# Patient Record
Sex: Female | Born: 1990 | Hispanic: No | Marital: Single | Smoking: Never smoker
Health system: Southern US, Community
[De-identification: ages and names within clinical notes are randomized; demographics above are authoritative.]

## PROBLEM LIST (undated history)

## (undated) DIAGNOSIS — Z789 Other specified health status: Secondary | ICD-10-CM

## (undated) HISTORY — PX: NO PAST SURGERIES: SHX2092

---
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
## 2012-06-27 ENCOUNTER — Emergency Department: Payer: Self-pay | Admitting: Emergency Medicine

## 2012-06-27 LAB — BASIC METABOLIC PANEL
Anion Gap: 6 — ABNORMAL LOW (ref 7–16)
BUN: 10 mg/dL (ref 7–18)
Calcium, Total: 8.5 mg/dL (ref 8.5–10.1)
Chloride: 106 mmol/L (ref 98–107)
Co2: 28 mmol/L (ref 21–32)
Creatinine: 0.81 mg/dL (ref 0.60–1.30)
EGFR (African American): >60
EGFR (Non-African Amer.): >60
Glucose: 75 mg/dL (ref 65–99)
Osmolality: 277 (ref 275–301)
Potassium: 3.1 mmol/L — ABNORMAL LOW (ref 3.5–5.1)
Sodium: 140 mmol/L (ref 136–145)

## 2012-06-27 LAB — CBC
HCT: 34.4 % — ABNORMAL LOW (ref 35.0–47.0)
HGB: 10.7 g/dL — ABNORMAL LOW (ref 12.0–16.0)
MCH: 26.3 pg (ref 26.0–34.0)
MCHC: 31.1 g/dL — ABNORMAL LOW (ref 32.0–36.0)
MCV: 85 fL (ref 80–100)
Platelet: 230 10*3/uL (ref 150–440)
RBC: 4.06 10*6/uL (ref 3.80–5.20)
RDW: 14.6 % — ABNORMAL HIGH (ref 11.5–14.5)
WBC: 15.3 10*3/uL — ABNORMAL HIGH (ref 3.6–11.0)

## 2014-06-26 ENCOUNTER — Emergency Department: Payer: Self-pay | Admitting: Emergency Medicine

## 2016-05-29 ENCOUNTER — Encounter (HOSPITAL_COMMUNITY): Payer: Self-pay | Admitting: *Deleted

## 2016-05-29 ENCOUNTER — Emergency Department (HOSPITAL_COMMUNITY)
Admission: EM | Admit: 2016-05-29 | Discharge: 2016-05-29 | Disposition: A | Discharge status: Home / Self Care | Payer: No Typology Code available for payment source | Attending: Emergency Medicine | Admitting: Emergency Medicine

## 2016-05-29 DIAGNOSIS — S161XXA Strain of muscle, fascia and tendon at neck level, initial encounter: Secondary | ICD-10-CM | POA: Diagnosis not present

## 2016-05-29 DIAGNOSIS — Y998 Other external cause status: Secondary | ICD-10-CM | POA: Diagnosis not present

## 2016-05-29 DIAGNOSIS — S3992XA Unspecified injury of lower back, initial encounter: Secondary | ICD-10-CM | POA: Insufficient documentation

## 2016-05-29 DIAGNOSIS — S199XXA Unspecified injury of neck, initial encounter: Secondary | ICD-10-CM | POA: Diagnosis present

## 2016-05-29 DIAGNOSIS — Y92410 Unspecified street and highway as the place of occurrence of the external cause: Secondary | ICD-10-CM | POA: Diagnosis not present

## 2016-05-29 DIAGNOSIS — Y9389 Activity, other specified: Secondary | ICD-10-CM | POA: Insufficient documentation

## 2016-05-29 MED ORDER — IBUPROFEN 100 MG/5ML PO SUSP: 600.0000 mg | Freq: Four times a day (QID) | ORAL | Status: DC | PRN
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
## 2016-05-29 NOTE — Discharge Instructions (Signed)

## 2016-05-29 NOTE — ED Notes (Signed)
Declined W/C at D/C and was escorted to lobby by RN. 

## 2018-07-09 ENCOUNTER — Other Ambulatory Visit: Payer: Self-pay

## 2018-07-09 DIAGNOSIS — R1030 Lower abdominal pain, unspecified: Secondary | ICD-10-CM | POA: Insufficient documentation

## 2018-07-09 DIAGNOSIS — N898 Other specified noninflammatory disorders of vagina: Secondary | ICD-10-CM | POA: Insufficient documentation

## 2018-07-09 DIAGNOSIS — N760 Acute vaginitis: Secondary | ICD-10-CM | POA: Insufficient documentation

## 2018-07-09 DIAGNOSIS — B9689 Other specified bacterial agents as the cause of diseases classified elsewhere: Secondary | ICD-10-CM | POA: Insufficient documentation

## 2018-07-09 LAB — COMPREHENSIVE METABOLIC PANEL
ALBUMIN: 3.6 g/dL (ref 3.5–5.0)
ALT: 13 U/L (ref 0–44)
ANION GAP: 9 (ref 5–15)
AST: 18 U/L (ref 15–41)
Alkaline Phosphatase: 61 U/L (ref 38–126)
BUN: 15 mg/dL (ref 6–20)
CHLORIDE: 104 mmol/L (ref 98–111)
CO2: 27 mmol/L (ref 22–32)
Calcium: 9.3 mg/dL (ref 8.9–10.3)
Creatinine, Ser: 0.75 mg/dL (ref 0.44–1.00)
GFR calc Af Amer: >60 mL/min (ref >60)
GFR calc non Af Amer: >60 mL/min (ref >60)
GLUCOSE: 87 mg/dL (ref 70–99)
POTASSIUM: 3.3 mmol/L — AB (ref 3.5–5.1)
SODIUM: 140 mmol/L (ref 135–145)
Total Bilirubin: 0.5 mg/dL (ref 0.3–1.2)
Total Protein: 7.4 g/dL (ref 6.5–8.1)

## 2018-07-09 LAB — CBC
HEMATOCRIT: 37.9 % (ref 36.0–46.0)
HEMOGLOBIN: 12 g/dL (ref 12.0–15.0)
MCH: 28.3 pg (ref 26.0–34.0)
MCHC: 31.7 g/dL (ref 30.0–36.0)
MCV: 89.4 fL (ref 78.0–100.0)
Platelets: 307 10*3/uL (ref 150–400)
RBC: 4.24 MIL/uL (ref 3.87–5.11)
RDW: 13.6 % (ref 11.5–15.5)
WBC: 8.6 10*3/uL (ref 4.0–10.5)

## 2018-07-09 LAB — I-STAT BETA HCG BLOOD, ED (MC, WL, AP ONLY): I-stat hCG, quantitative: <5 m[IU]/mL (ref <5)

## 2018-07-09 LAB — LIPASE, BLOOD: LIPASE: 34 U/L (ref 11–51)

## 2018-07-09 NOTE — ED Triage Notes (Signed)
Pt presents to ED from home for ABD pain. Pt reports that pain has been going on for about a month, but worse in the last few days. Pt reports pain in lower ABD, denies N/V/D. No fevers/chills. Pt reports increase in vaginal discharge, but no bleeding.

## 2018-07-10 ENCOUNTER — Emergency Department (HOSPITAL_COMMUNITY)
Admission: EM | Admit: 2018-07-10 | Discharge: 2018-07-10 | Disposition: A | Discharge status: Home / Self Care | Payer: Self-pay | Attending: Emergency Medicine | Admitting: Emergency Medicine

## 2018-07-10 DIAGNOSIS — R1030 Lower abdominal pain, unspecified: Secondary | ICD-10-CM

## 2018-07-10 DIAGNOSIS — B9689 Other specified bacterial agents as the cause of diseases classified elsewhere: Secondary | ICD-10-CM

## 2018-07-10 DIAGNOSIS — N760 Acute vaginitis: Secondary | ICD-10-CM

## 2018-07-10 LAB — URINALYSIS, ROUTINE W REFLEX MICROSCOPIC
Bilirubin Urine: NEGATIVE
GLUCOSE, UA: NEGATIVE mg/dL
Ketones, ur: 5 mg/dL — AB
Leukocytes, UA: NEGATIVE
NITRITE: NEGATIVE
PROTEIN: NEGATIVE mg/dL
SPECIFIC GRAVITY, URINE: 1.025 (ref 1.005–1.030)
pH: 5 (ref 5.0–8.0)

## 2018-07-10 LAB — HIV ANTIBODY (ROUTINE TESTING W REFLEX): HIV Screen 4th Generation wRfx: NONREACTIVE

## 2018-07-10 LAB — WET PREP, GENITAL
SPERM: NONE SEEN
Trich, Wet Prep: NONE SEEN
YEAST WET PREP: NONE SEEN

## 2018-07-10 LAB — RPR: RPR Ser Ql: NONREACTIVE

## 2018-07-10 MED ORDER — METRONIDAZOLE 0.75 % VA GEL: 1.0000 | Freq: Two times a day (BID) | VAGINAL | 0 refills | Status: DC

## 2018-07-10 NOTE — Discharge Instructions (Addendum)
Use the Metrogel as directed for bacterial vaginosis infection. It is [facility] and recheck of abnormal appearance of the cervix.

## 2018-07-10 NOTE — ED Provider Notes (Signed)
[facility]-EMERGENCY DEPT Provider Note   CSN: [id number] Arrival date & time: 07/09/18  2050     History   Chief Complaint Chief Complaint  Patient presents with  . Abdominal Pain    HPI Marie Williamson is a 27 y.o. female.  Patient is here for evaluation of lower abdominal pain that started 2 weeks ago. The discomfort is intermittent last ing a few minutes when it starts. No fever, nausea, vomiting, dysuria or irregular menses. She is not on exogenous hormones of any kind. She reports vaginal discharge that is mild, does not have an odor. She states she and her longtime boyfriend stopped using barrier protection birth control several months ago and that is when symptoms started altogether.   The history is provided by the patient. No language interpreter was used.  Abdominal Pain   Pertinent negatives include fever, nausea, vomiting and dysuria.    No past medical history on file.  There are no active problems to display for this patient.   No past surgical history on file.   OB History   None      Home Medications    Prior to Admission medications   Medication Sig Start Date End Date Taking? Authorizing Provider  ibuprofen (CHILDRENS MOTRIN) 100 MG/5ML suspension Take 30 mLs (600 mg total) by mouth every 6 (six) hours as needed for moderate pain. Patient not taking: Reported on 07/10/2018 05/29/16   Cheri Fowlerose, Kayla, PA-C    Family History No family history on file.  Social History Social History   Tobacco Use  . Smoking status: Never Smoker  Substance Use Topics  . Alcohol use: No  . Drug use: No     Allergies   Patient has no known allergies.   Review of Systems Review of Systems  Constitutional: Negative for chills and fever.  Respiratory: Negative.   Cardiovascular: Negative.   Gastrointestinal: Positive for abdominal pain. Negative for nausea and vomiting.  Genitourinary: Positive for vaginal discharge. Negative for dysuria  and menstrual problem.  Musculoskeletal: Negative.   Skin: Negative.   Neurological: Negative.      Physical Exam Updated Vital Signs BP (!) 110/97 (BP Location: Right Arm)   Pulse 68   Temp 98.3 F (36.8 C) (Oral)   Resp 19   Ht 5\' 7"  (1.702 m)   Wt (!) 138.7 kg (305 lb 11.2 oz)   SpO2 100%   BMI 47.88 kg/m   Physical Exam  Constitutional: She appears well-developed and well-nourished.  HENT:  Head: Normocephalic.  Neck: Normal range of motion. Neck supple.  Cardiovascular: Normal rate and regular rhythm.  Pulmonary/Chest: Effort normal and breath sounds normal.  Abdominal: Soft. Bowel sounds are normal. There is no tenderness. There is no rebound and no guarding.  Genitourinary:  Genitourinary Comments: White vaginal discharge that has smooth consistency. Cervical opening is widened with intracervical mass present. No redness, friability, discharge or tenderness. No adnexal mass or tenderness.   Musculoskeletal: Normal range of motion.  Neurological: She is alert. No cranial nerve deficit.  Skin: Skin is warm and dry. No rash noted.  Psychiatric: She has a normal mood and affect.     ED Treatments / Results  Labs (all labs ordered are listed, but only abnormal results are displayed) Labs Reviewed  WET PREP, GENITAL - Abnormal; Notable for the following components:      Result Value   Clue Cells Wet Prep HPF POC PRESENT (*)    WBC, Wet Prep HPF  POC MODERATE (*)    All other components within normal limits  COMPREHENSIVE METABOLIC PANEL - Abnormal; Notable for the following components:   Potassium 3.3 (*)    All other components within normal limits  URINALYSIS, ROUTINE W REFLEX MICROSCOPIC - Abnormal; Notable for the following components:   APPearance HAZY (*)    Hgb urine dipstick SMALL (*)    Ketones, ur 5 (*)    Bacteria, UA RARE (*)    All other components within normal limits  LIPASE, BLOOD  CBC  RPR  HIV ANTIBODY (ROUTINE TESTING)  I-STAT BETA HCG  BLOOD, ED ([facility], [facility], [facility] ONLY)  GC/CHLAMYDIA PROBE AMP ([place]) NOT AT [facility]    EKG None  Radiology No results found.  Procedures Procedures (including critical care time)  Medications Ordered in ED Medications - No data to display   Initial Impression / Assessment and Plan / ED Course  I have reviewed the triage vital signs and the nursing notes.  Pertinent labs & imaging results that were available during my care of the patient were reviewed by me and considered in my medical decision making (see chart for details).     Patient presents with intermittent lower abdominal pain for several months, increased frequency and intensity for the past 2 weeks. Pain comes and goes, lasting 1-2 minutes at a time.   Vaginal exam is not concerning for STD based on appearance of discharge and lack of tenderness. Cervical exam is abnormal but with duration of symptoms, normal labs and vital signs, it is felt this can be worked up in the outpatient setting. She is referred to [facility] for further gynecologic evaluation. Stressed the importance of follow up to determine a diagnosis and patient acknowledged understanding.   She does have BV and is given Rx for Metrogel. She is appropriate for discharge home.   Final Clinical Impressions(s) / ED Diagnoses   Final diagnoses:  None   1. BV 2. Abnormal cervical exam  ED Discharge Orders    None       Elpidio Anis, Cordelia Poche 07/10/18 0535    Glynn Octave, MD 07/10/18 [phone]

## 2018-07-11 LAB — GC/CHLAMYDIA PROBE AMP (~~LOC~~) NOT AT ARMC
Chlamydia: NEGATIVE
NEISSERIA GONORRHEA: NEGATIVE

## 2018-07-26 ENCOUNTER — Encounter (HOSPITAL_COMMUNITY): Payer: Self-pay | Admitting: Emergency Medicine

## 2018-07-26 ENCOUNTER — Emergency Department (HOSPITAL_COMMUNITY)
Admission: EM | Admit: 2018-07-26 | Discharge: 2018-07-27 | Disposition: A | Discharge status: Home / Self Care | Payer: BLUE CROSS/BLUE SHIELD | Attending: Emergency Medicine | Admitting: Emergency Medicine

## 2018-07-26 ENCOUNTER — Emergency Department (HOSPITAL_COMMUNITY): Payer: BLUE CROSS/BLUE SHIELD

## 2018-07-26 DIAGNOSIS — R0789 Other chest pain: Secondary | ICD-10-CM | POA: Diagnosis not present

## 2018-07-26 DIAGNOSIS — R079 Chest pain, unspecified: Secondary | ICD-10-CM | POA: Diagnosis present

## 2018-07-26 LAB — I-STAT TROPONIN, ED: Troponin i, poc: 0 ng/mL (ref 0.00–0.08)

## 2018-07-26 LAB — CBC
HEMATOCRIT: 36.7 % (ref 36.0–46.0)
Hemoglobin: 11.2 g/dL — ABNORMAL LOW (ref 12.0–15.0)
MCH: 27.5 pg (ref 26.0–34.0)
MCHC: 30.5 g/dL (ref 30.0–36.0)
MCV: 90.2 fL (ref 78.0–100.0)
PLATELETS: 266 10*3/uL (ref 150–400)
RBC: 4.07 MIL/uL (ref 3.87–5.11)
RDW: 12.9 % (ref 11.5–15.5)
WBC: 7.7 10*3/uL (ref 4.0–10.5)

## 2018-07-26 LAB — BASIC METABOLIC PANEL
Anion gap: 7 (ref 5–15)
BUN: 11 mg/dL (ref 6–20)
CO2: 24 mmol/L (ref 22–32)
CREATININE: 0.86 mg/dL (ref 0.44–1.00)
Calcium: 8.6 mg/dL — ABNORMAL LOW (ref 8.9–10.3)
Chloride: 107 mmol/L (ref 98–111)
GFR calc Af Amer: >60 mL/min (ref >60)
GLUCOSE: 92 mg/dL (ref 70–99)
POTASSIUM: 3.7 mmol/L (ref 3.5–5.1)
SODIUM: 138 mmol/L (ref 135–145)

## 2018-07-26 LAB — I-STAT BETA HCG BLOOD, ED (MC, WL, AP ONLY): I-stat hCG, quantitative: <5 m[IU]/mL (ref <5)

## 2018-07-26 NOTE — ED Notes (Signed)
Results reviewed.  No changes in acuity at this time 

## 2018-07-26 NOTE — ED Notes (Signed)
NT made this RN aware of patient's HR of low 50's.  Patient does not c/o any changes in symptoms.  Will continue to monitor

## 2018-07-26 NOTE — ED Notes (Signed)
Results reviewed, no changes in acuity at this time 

## 2018-07-26 NOTE — ED Triage Notes (Signed)
Pt presents to ED for assessment of left sided chest pain, radiating from her left neck, left shoulder, and under her left breast starting Thursday.  Patient states sharp and intense on Thursday, now residual pressure.  Patient c/o SOB with it, denies n/v, denies light-headedness.  States it was so bad Thursday she couldn't sleep.  States she was having difficulty "completing a yawn" and taking a deep breath

## 2018-07-27 MED ORDER — PANTOPRAZOLE SODIUM 20 MG PO TBEC: 20.0000 mg | DELAYED_RELEASE_TABLET | Freq: Every day | ORAL | 0 refills | Status: DC

## 2018-07-27 MED ORDER — ACETAMINOPHEN 500 MG PO TABS
1000.0000 mg | ORAL_TABLET | Freq: Once | ORAL | Status: AC
  Administered 2018-07-27: 1000 mg via ORAL
  Filled 2018-07-27: qty 2

## 2018-07-27 MED ORDER — GI COCKTAIL ~~LOC~~
30.0000 mL | Freq: Once | ORAL | Status: AC
  Administered 2018-07-27: 30 mL via ORAL
  Filled 2018-07-27: qty 30

## 2018-07-27 NOTE — Discharge Instructions (Signed)
Take protonix daily for pain.  Follow up with a primary care doctor. You may contact the [phone] number in the back of this paperwork to help set up primary care.  Follow up with ob/gyn for further evaluation of your cramps.  Return to the ER if you develop worsening pain, increased shortness of breath, or any new or concerning symptoms.

## 2018-07-27 NOTE — ED Notes (Signed)
ED Provider at bedside. 

## 2018-07-27 NOTE — ED Provider Notes (Signed)
[facility] EMERGENCY [facility]PARTMENT Provider Note   CSN: [id number] Arrival date & time: 07/26/18  1929     History   Chief Complaint Chief Complaint  Patient presents with  . Chest Pain    HPI Marie Williamson is a 27 y.o. female presenting for evaluation of chest pain.  Patient states 3 days ago, she started to develop left-sided chest pain/pressure.  This began while she was laying flat and after taking increased Motrin due to menstrual cramps.  She states pain is worse when she eats certain foods and with inspiration.  She denies associated shortness of breath.  Denies fevers, chills, cough, nausea, vomiting, abdominal pain, diaphoresis, urinary symptoms, leg pain or swelling.  Denies recent travel, surgeries, immobilization, trauma, history of cancer, history of prior DVT/PE, or hormone use.  She states she has been burping more frequently.  She recently finished a course of antibiotics.  HPI  History reviewed. No pertinent past medical history.  There are no active problems to display for this patient.   History reviewed. No pertinent surgical history.   OB History   None      Home Medications    Prior to Admission medications   Medication Sig Start Date End Date Taking? Authorizing Provider  ibuprofen (CHILDRENS MOTRIN) 100 MG/5ML suspension Take 30 mLs (600 mg total) by mouth every 6 (six) hours as needed for moderate pain. Patient not taking: Reported on 07/10/2018 05/29/16   Cheri Fowlerose, Kayla, PA-C  metroNIDAZOLE (METROGEL VAGINAL) 0.75 % vaginal gel Place 1 Applicatorful vaginally 2 (two) times daily. 07/10/18   Elpidio AnisUpstill, Shari, PA-C  pantoprazole (PROTONIX) 20 MG tablet Take 1 tablet (20 mg total) by mouth daily. 07/27/18   Kael Keetch, PA-C    Family History History reviewed. No pertinent family history.  Social History Social History   Tobacco Use  . Smoking status: Never Smoker  . Smokeless tobacco: Never Used  Substance Use Topics  .  Alcohol use: No  . Drug use: No     Allergies   Patient has no known allergies.   Review of Systems Review of Systems  Cardiovascular: Positive for chest pain.  Gastrointestinal:       Burping  All other systems reviewed and are negative.    Physical Exam Updated Vital Signs BP 91/70 (BP Location: Right Arm)   Pulse 61   Temp 98.9 F (37.2 C) (Oral)   Resp 17   LMP 07/26/2018   SpO2 100%   Physical Exam  Constitutional: She is oriented to person, place, and time. She appears well-developed and well-nourished. No distress.  Resting comfortably in bed in no distress  HENT:  Head: Normocephalic and atraumatic.  Eyes: Pupils are equal, round, and reactive to light. Conjunctivae and EOM are normal.  Neck: Normal range of motion. Neck supple.  Cardiovascular: Normal rate, regular rhythm and intact distal pulses.  Pulmonary/Chest: Effort normal and breath sounds normal. No respiratory distress. She has no wheezes. She exhibits tenderness.  Tenderness palpation of central chest wall and epigastric abdomen.  Clear lung sounds in all fields.  Abdominal: Soft. Bowel sounds are normal. She exhibits no distension and no mass. There is tenderness. There is no rebound and no guarding.  Mild epigastric tenderness.  Musculoskeletal: Normal range of motion.  No leg pain or swelling.  Pedal pulses intact bilaterally.  Neurological: She is alert and oriented to person, place, and time.  Skin: Skin is warm and dry.  Psychiatric: She has a normal mood  and affect.  Nursing note and vitals reviewed.    ED Treatments / Results  Labs (all labs ordered are listed, but only abnormal results are displayed) Labs Reviewed  BASIC METABOLIC PANEL - Abnormal; Notable for the following components:      Result Value   Calcium 8.6 (*)    All other components within normal limits  CBC - Abnormal; Notable for the following components:   Hemoglobin 11.2 (*)    All other components within normal  limits  I-STAT TROPONIN, ED  I-STAT BETA HCG BLOOD, ED ([facility], [facility], [facility] ONLY)    EKG None  Radiology Dg Chest 2 View  Result Date: 07/26/2018 CLINICAL DATA:  Chest tightness/pressure with SOB on and off since Thursday EXAM: CHEST - 2 VIEW COMPARISON:  None. FINDINGS: The heart size and mediastinal contours are within normal limits. Both lungs are clear. The visualized skeletal structures are unremarkable. IMPRESSION: No active cardiopulmonary disease. Electronically Signed   By: Norva Pavlov M.D.   On: 07/26/2018 19:54    Procedures Procedures (including critical care time)  Medications Ordered in ED Medications  gi cocktail (Maalox,Lidocaine,Donnatal) (30 mLs Oral Given 07/27/18 0213)  acetaminophen (TYLENOL) tablet 1,000 mg (1,000 mg Oral Given 07/27/18 0212)     Initial Impression / Assessment and Plan / ED Course  I have reviewed the triage vital signs and the nursing notes.  Pertinent labs & imaging results that were available during my care of the patient were reviewed by me and considered in my medical decision making (see chart for details).     Patient presented for evaluation of chest pain.  Physical exam reassuring, she is afebrile not tachycardic.  Appears nontoxic.  Pain is reproducible with palpation of the chest wall.  She has associated burping. Initial cardiac work-up reassuring, troponin negative, EKG without STEMI, chest x-ray read interpreted by me, no ammonia or cardiomegaly.  Doubt ACS.  Low risk for PE.  Symptoms began after finishing antibiotics after taking increased NSAIDs.  Thus question gastritis/gerd or other GI etiology of sxs.  Discussed with patient.  Discussed follow-up with primary care for further evaluation of her symptoms.  Patient's mom requesting more information about menstrual cramps, recommended she follow-up with OB/GYN, patient is not currently having any menstrual cramps.  GI cocktail given for symptom relief.  At this time, patient appears safe  for discharge.  Return precautions given.  Patient states he understands and agrees plan.   Final Clinical Impressions(s) / ED Diagnoses   Final diagnoses:  Atypical chest pain    ED Discharge Orders        Ordered    pantoprazole (PROTONIX) 20 MG tablet  Daily     07/27/18 0233       Alveria Apley, PA-C 07/27/18 0252    Geoffery Lyons, MD 07/28/18 [phone]

## 2018-07-27 NOTE — ED Notes (Signed)
Patient left at this time with all belongings. 

## 2018-08-20 ENCOUNTER — Ambulatory Visit: Payer: Self-pay | Admitting: Obstetrics and Gynecology

## 2019-07-17 IMAGING — DX DG CHEST 2V
2 series · 2 of 2 positions shown · non-contrast
Comparison: None.

CLINICAL DATA: Chest tightness/pressure with SOB on and off since
[REDACTED]

EXAM:
CHEST - 2 VIEW

[w chest pa]
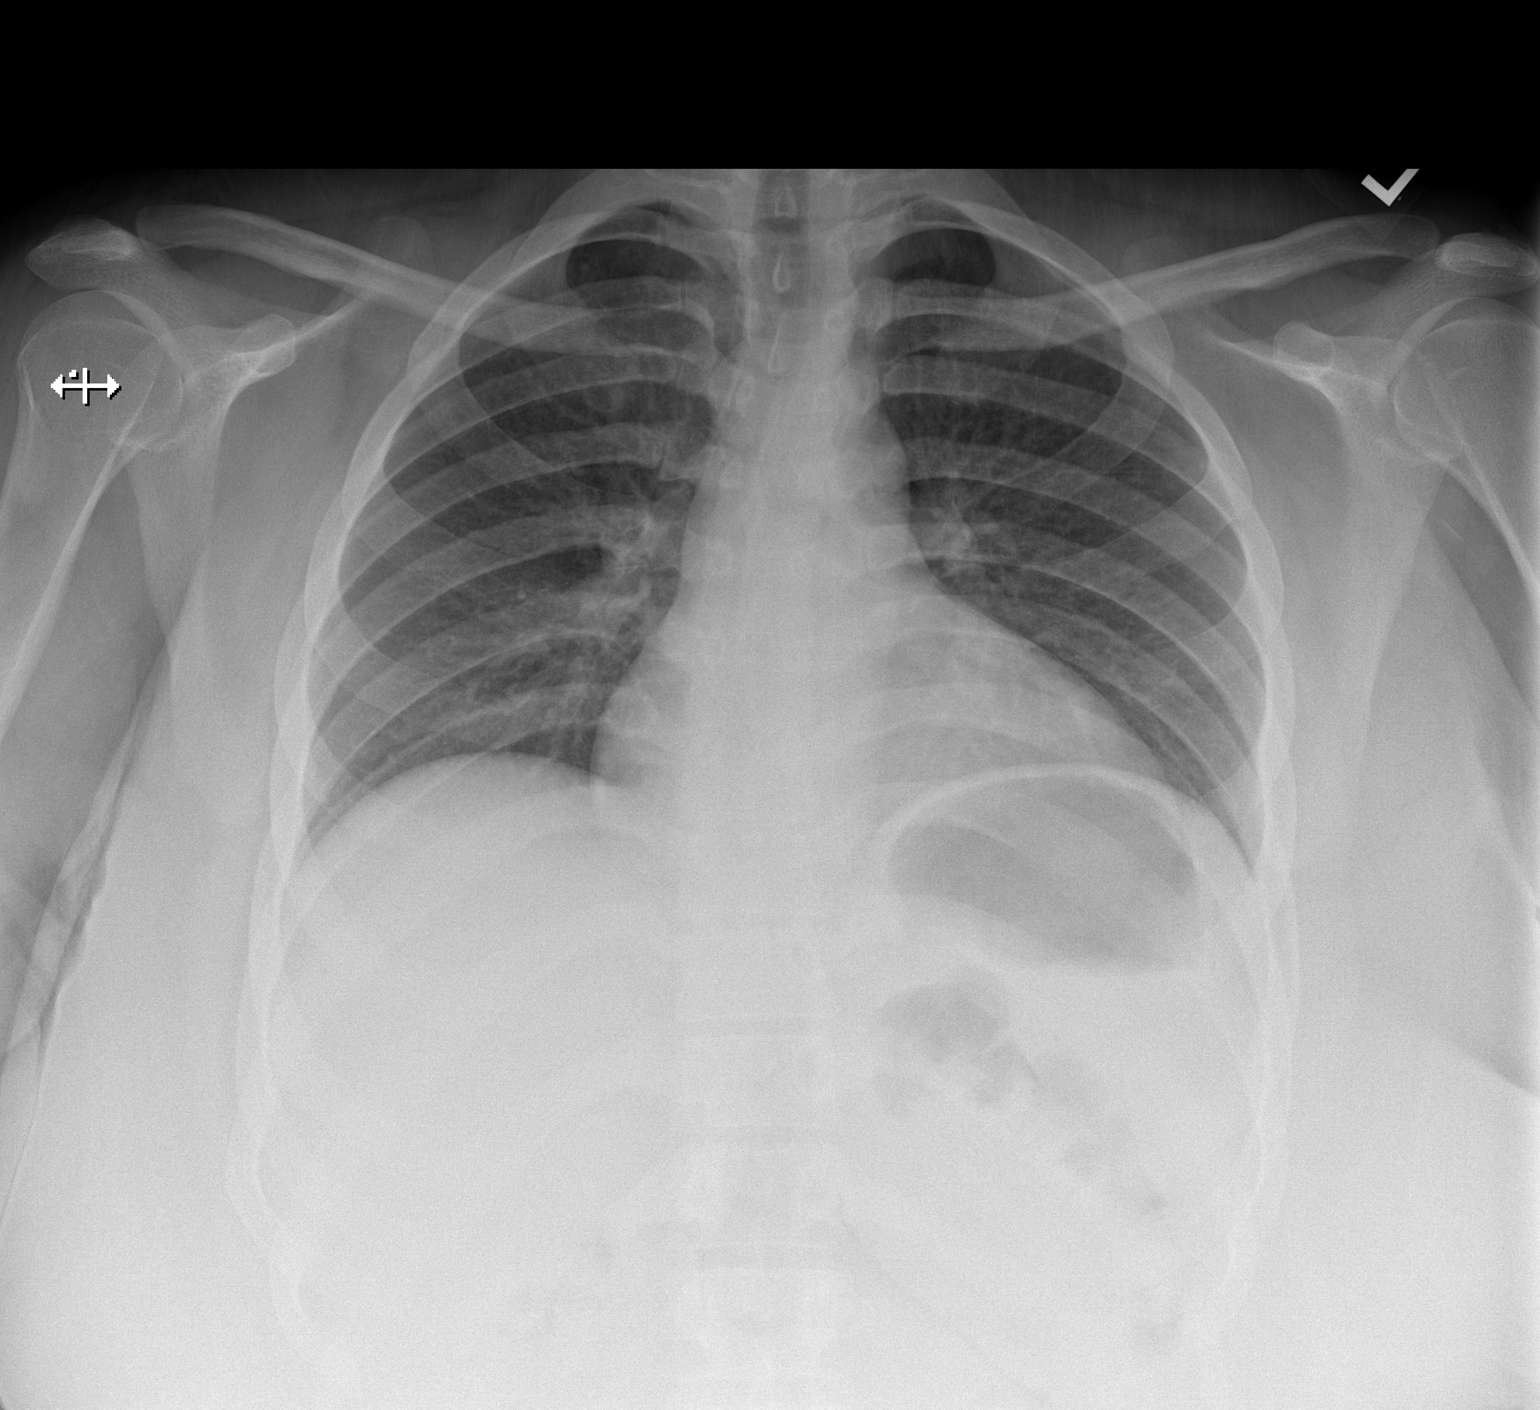

[w chest lat]
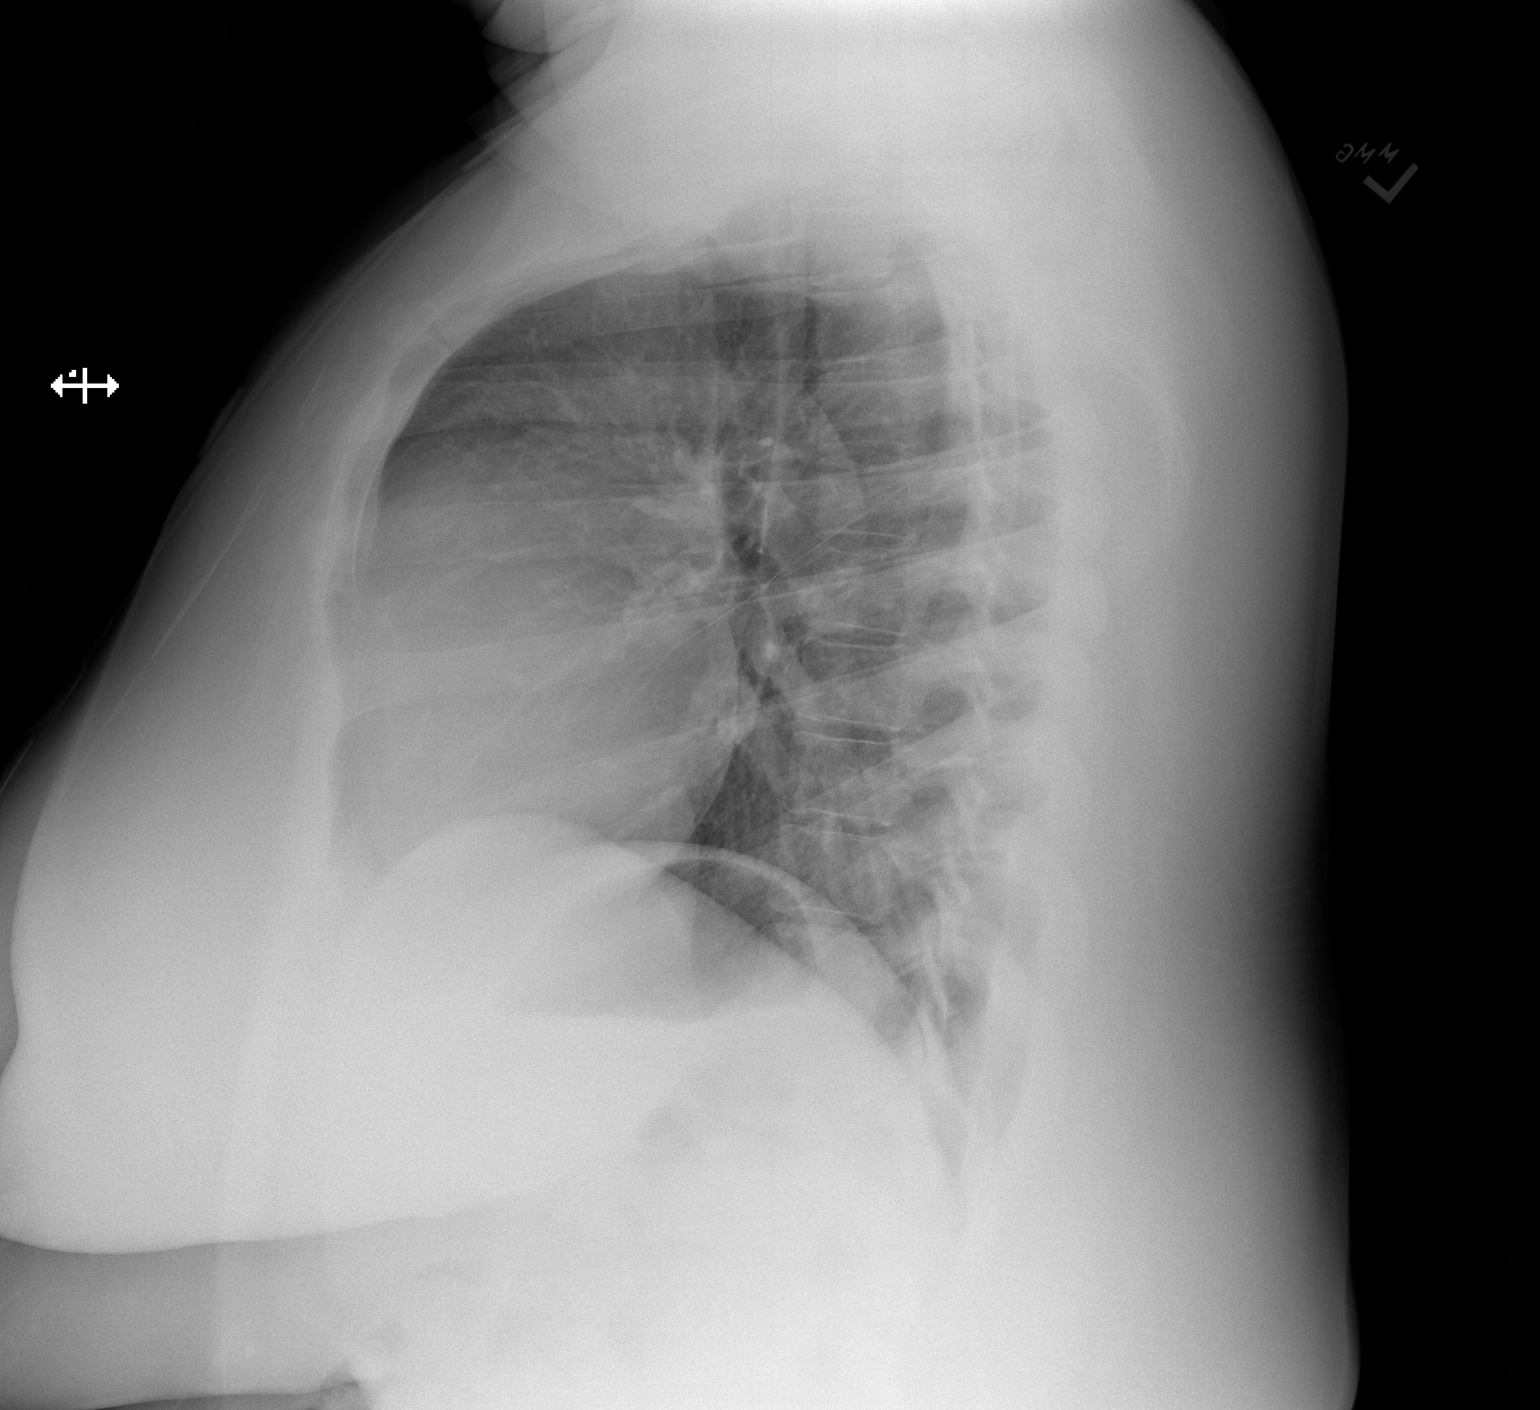

[2 of 2 positions shown; findings below may reference images not displayed]

FINDINGS: The heart size and mediastinal contours are within normal limits.
Both lungs are clear. The visualized skeletal structures are
unremarkable.
IMPRESSION: No active cardiopulmonary disease.

## 2020-12-24 NOTE — L&D Delivery Note (Addendum)
[facility]N Faculty [facility] Delivery Note  Marie Williamson is a 30 y.o. G1P0000 s/p SVD at [redacted]w[redacted]d. She was admitted for post-dates IOL.   ROM: 0h 101m with clear fluid GBS Status: Negative Maximum Maternal Temperature: 98.2 F  Delivery Date/Time: 12/15/2021 at 0539 Delivery: Balloon came out around 0445.  Pt continued to quickly progress and was noted to be C/C/BBOW around 0515. Has SROM soon after, had urge to push . AFter a 20 minute 2nd stage:  Head delivered. No nuchal cord present. Shoulder and body delivered in usual fashion. Infant with spontaneous cry, placed on mother's abdomen, dried and stimulated. Cord clamped x 2 after 1-minute delay, and cut by FOB. Cord blood drawn. Placenta delivered spontaneously with gentle cord traction. Fundus boggy and did not resolve with massage. Pitocin started and rectal cytotec placed with continued bleeding and atonic uterus. Jada balloon inserted without difficulty and attached to wall suction. Dr. Jolayne Panther made aware and came to room.  Bleeding quickly subsided. Labia, perineum, vagina, and cervix inspected inspected with 1st degree perineal laceration repaired in usual fashion with 2-0 vicryl under local anesthesia.   Placenta: 3VC; Intact- Sent to L&D Complications: None Lacerations: 1st degree perineal laceration repaired with 2-0 vicryl EBL: 400 mL Analgesia: 1% Lidocaine  Infant: Viable   APGARs 8 & 9   pending weight  Marie Williamson, D.O. [facility], PGY-1 12/15/2021, 6:44 AM  The above was performed under my direct supervision and guidance.

## 2021-04-10 ENCOUNTER — Encounter: Payer: Self-pay | Admitting: *Deleted

## 2021-04-10 ENCOUNTER — Encounter: Payer: Self-pay | Admitting: Family Medicine

## 2021-04-10 ENCOUNTER — Ambulatory Visit (INDEPENDENT_AMBULATORY_CARE_PROVIDER_SITE_OTHER): Payer: Self-pay | Admitting: *Deleted

## 2021-04-10 VITALS — BP 121/84 | HR 63 | Ht 67.0 in

## 2021-04-10 DIAGNOSIS — Z3201 Encounter for pregnancy test, result positive: Secondary | ICD-10-CM

## 2021-04-10 DIAGNOSIS — Z3200 Encounter for pregnancy test, result unknown: Secondary | ICD-10-CM

## 2021-04-10 LAB — POCT PREGNANCY, URINE: Preg Test, Ur: POSITIVE — AB

## 2021-04-10 NOTE — Progress Notes (Signed)
Pt presented earlier today for pregnancy test and was informed of positive result. She reports sure LMP 03/02/21 which yields EDD 12/07/21, now [redacted]w[redacted]d. She denies problems @ current time. She was advised to begin taking prenatal vitamins.  Pt was instructed to go to [facility] if she develops heavy vaginal bleeding or abdominal/pelvic pain. She will review list of Ob providers given today and schedule prenatal care. Dr. Vergie Living in to see pt.

## 2021-04-11 NOTE — Progress Notes (Signed)
Patient was assessed and managed by nursing staff during this encounter. I have reviewed the chart and agree with the documentation and plan. I have also made any necessary editorial changes.  Jovann Luse, MD 04/11/2021 9:22 AM   

## 2021-05-02 ENCOUNTER — Telehealth: Payer: Self-pay

## 2021-05-02 MED ORDER — PROMETHAZINE HCL 25 MG PO TABS: 25.0000 mg | ORAL_TABLET | Freq: Four times a day (QID) | ORAL | 0 refills | Status: DC | PRN

## 2021-05-02 NOTE — Telephone Encounter (Signed)
Pt called stating that she is having extreme nausea and fatigue.  Called pt and pt informed that she is nausea most of the day keeping her from eating and drinking.  I advised pt that I can e-prescribe per standing protocol Phenergan however it can make her drowsy.  I informed pt that fatigue can very normal in pregnancy and to get rest when she can.  I also encouraged pt to be mindful that the flu and virus is going around.  Pt verbalized understanding with no further questions.   Marie Williamson  05/02/21

## 2021-05-15 ENCOUNTER — Encounter (HOSPITAL_COMMUNITY): Payer: Self-pay | Admitting: *Deleted

## 2021-05-15 ENCOUNTER — Inpatient Hospital Stay (HOSPITAL_COMMUNITY)
Admission: AD | Admit: 2021-05-15 | Discharge: 2021-05-15 | Disposition: A | Discharge status: Home / Self Care | Payer: 59 | Attending: Obstetrics & Gynecology | Admitting: Obstetrics & Gynecology

## 2021-05-15 ENCOUNTER — Other Ambulatory Visit: Payer: Self-pay

## 2021-05-15 DIAGNOSIS — O211 Hyperemesis gravidarum with metabolic disturbance: Secondary | ICD-10-CM

## 2021-05-15 DIAGNOSIS — O219 Vomiting of pregnancy, unspecified: Secondary | ICD-10-CM | POA: Insufficient documentation

## 2021-05-15 DIAGNOSIS — Z20822 Contact with and (suspected) exposure to covid-19: Secondary | ICD-10-CM | POA: Diagnosis not present

## 2021-05-15 DIAGNOSIS — O26811 Pregnancy related exhaustion and fatigue, first trimester: Secondary | ICD-10-CM

## 2021-05-15 DIAGNOSIS — Z3A10 10 weeks gestation of pregnancy: Secondary | ICD-10-CM | POA: Insufficient documentation

## 2021-05-15 DIAGNOSIS — O26891 Other specified pregnancy related conditions, first trimester: Secondary | ICD-10-CM | POA: Diagnosis not present

## 2021-05-15 DIAGNOSIS — R824 Acetonuria: Secondary | ICD-10-CM | POA: Diagnosis not present

## 2021-05-15 DIAGNOSIS — R519 Headache, unspecified: Secondary | ICD-10-CM | POA: Insufficient documentation

## 2021-05-15 HISTORY — DX: Other specified health status: Z78.9

## 2021-05-15 LAB — COMPREHENSIVE METABOLIC PANEL
ALT: 25 U/L (ref 0–44)
AST: 20 U/L (ref 15–41)
Albumin: 3.1 g/dL — ABNORMAL LOW (ref 3.5–5.0)
Alkaline Phosphatase: 58 U/L (ref 38–126)
Anion gap: 7 (ref 5–15)
BUN: 7 mg/dL (ref 6–20)
CO2: 25 mmol/L (ref 22–32)
Calcium: 9 mg/dL (ref 8.9–10.3)
Chloride: 101 mmol/L (ref 98–111)
Creatinine, Ser: 0.6 mg/dL (ref 0.44–1.00)
GFR, Estimated: >60 mL/min (ref >60)
Glucose, Bld: 84 mg/dL (ref 70–99)
Potassium: 4 mmol/L (ref 3.5–5.1)
Sodium: 133 mmol/L — ABNORMAL LOW (ref 135–145)
Total Bilirubin: 0.5 mg/dL (ref 0.3–1.2)
Total Protein: 7.9 g/dL (ref 6.5–8.1)

## 2021-05-15 LAB — URINALYSIS, ROUTINE W REFLEX MICROSCOPIC
Bilirubin Urine: NEGATIVE
Glucose, UA: NEGATIVE mg/dL
Hgb urine dipstick: NEGATIVE
Ketones, ur: 20 mg/dL — AB
Leukocytes,Ua: NEGATIVE
Nitrite: NEGATIVE
Protein, ur: NEGATIVE mg/dL
Specific Gravity, Urine: 1.023 (ref 1.005–1.030)
pH: 6 (ref 5.0–8.0)

## 2021-05-15 LAB — CBC
HCT: 38.8 % (ref 36.0–46.0)
Hemoglobin: 12.4 g/dL (ref 12.0–15.0)
MCH: 28.1 pg (ref 26.0–34.0)
MCHC: 32 g/dL (ref 30.0–36.0)
MCV: 88 fL (ref 80.0–100.0)
Platelets: 270 10*3/uL (ref 150–400)
RBC: 4.41 MIL/uL (ref 3.87–5.11)
RDW: 13.5 % (ref 11.5–15.5)
WBC: 5.6 10*3/uL (ref 4.0–10.5)
nRBC: 0 % (ref 0.0–0.2)

## 2021-05-15 LAB — SARS CORONAVIRUS 2 (TAT 6-24 HRS): SARS Coronavirus 2: NEGATIVE

## 2021-05-15 MED ORDER — ACETAMINOPHEN 325 MG PO TABS: 650.0000 mg | ORAL_TABLET | ORAL | 0 refills | Status: AC | PRN

## 2021-05-15 MED ORDER — ACETAMINOPHEN 500 MG PO TABS
1000.0000 mg | ORAL_TABLET | Freq: Once | ORAL | Status: AC
  Administered 2021-05-15: 1000 mg via ORAL
  Filled 2021-05-15: qty 2

## 2021-05-15 MED ORDER — LORATADINE 10 MG PO TABS
10.0000 mg | ORAL_TABLET | Freq: Every day | ORAL | Status: DC
  Administered 2021-05-15: 10 mg via ORAL
  Filled 2021-05-15: qty 1

## 2021-05-15 MED ORDER — ONDANSETRON 4 MG PO TBDP
4.0000 mg | ORAL_TABLET | Freq: Once | ORAL | Status: AC
  Administered 2021-05-15: 4 mg via ORAL
  Filled 2021-05-15: qty 1

## 2021-05-15 MED ORDER — LORATADINE 10 MG PO TABS: 10.0000 mg | ORAL_TABLET | Freq: Every day | ORAL | 0 refills | Status: DC

## 2021-05-15 MED ORDER — ONDANSETRON 4 MG PO TBDP: 4.0000 mg | ORAL_TABLET | Freq: Four times a day (QID) | ORAL | 0 refills | Status: DC | PRN

## 2021-05-15 NOTE — Discharge Instructions (Signed)
Safe Medications in Pregnancy   Acne: Benzoyl Peroxide Salicylic Acid  Backache/Headache: Tylenol: 2 regular strength every 4 hours OR              2 Extra strength every 6 hours  Colds/Coughs/Allergies: Benadryl (alcohol free) 25 mg every 6 hours as needed Breath right strips Claritin Cepacol throat lozenges Chloraseptic throat spray Cold-Eeze- up to three times per day Cough drops, alcohol free Flonase (by prescription only) Guaifenesin Mucinex Robitussin DM (plain only, alcohol free) Saline nasal spray/drops Sudafed (pseudoephedrine) & Actifed ** use only after [redacted] weeks gestation and if you do not have high blood pressure Tylenol Vicks Vaporub Zinc lozenges Zyrtec   Constipation: Colace Ducolax suppositories Fleet enema Glycerin suppositories Metamucil Milk of magnesia Miralax Senokot Smooth move tea  Diarrhea: Kaopectate Imodium A-D  *NO pepto Bismol  Hemorrhoids: Anusol Anusol HC Preparation H Tucks  Indigestion: Tums Maalox Mylanta Zantac  Pepcid  Insomnia: Benadryl (alcohol free) 25mg every 6 hours as needed Tylenol PM Unisom, no Gelcaps  Leg Cramps: Tums MagGel  Nausea/Vomiting:  Bonine Dramamine Emetrol Ginger extract Sea bands Meclizine  Nausea medication to take during pregnancy:  Unisom (doxylamine succinate 25 mg tablets) Take one tablet daily at bedtime. If symptoms are not adequately controlled, the dose can be increased to a maximum recommended dose of two tablets daily (1/2 tablet in the morning, 1/2 tablet mid-afternoon and one at bedtime). Vitamin B6 100mg tablets. Take one tablet twice a day (up to 200 mg per day).  Skin Rashes: Aveeno products Benadryl cream or 25mg every 6 hours as needed Calamine Lotion 1% cortisone cream  Yeast infection: Gyne-lotrimin 7 Monistat 7   **If taking multiple medications, please check labels to avoid duplicating the same active ingredients **take medication as directed on  the label ** Do not exceed 4000 mg of tylenol in 24 hours **Do not take medications that contain aspirin or ibuprofen     https://www.cdc.gov/pregnancy/infections.html">  First Trimester of Pregnancy  The first trimester of pregnancy starts on the first day of your last menstrual period until the end of week 12. This is also called months 1 through 3 of pregnancy. Body changes during your first trimester Your body goes through many changes during pregnancy. The changes usually return to normal after your baby is born. Physical changes  You may gain or lose weight.  Your breasts may grow larger and hurt. The area around your nipples may get darker.  Dark spots or blotches may develop on your face.  You may have changes in your hair. Health changes  You may feel like you might vomit (nauseous), and you may vomit.  You may have heartburn.  You may have headaches.  You may have trouble pooping (constipation).  Your gums may bleed. Other changes  You may get tired easily.  You may pee (urinate) more often.  Your menstrual periods will stop.  You may not feel hungry.  You may want to eat certain kinds of food.  You may have changes in your emotions from day to day.  You may have more dreams. Follow these instructions at home: Medicines  Take over-the-counter and prescription medicines only as told by your doctor. Some medicines are not safe during pregnancy.  Take a prenatal vitamin that contains at least 600 micrograms (mcg) of folic acid. Eating and drinking  Eat healthy meals that include: ? Fresh fruits and vegetables. ? Whole grains. ? Good sources of protein, such as meat, eggs, or tofu. ? Low-fat   dairy products.  Avoid raw meat and unpasteurized juice, milk, and cheese.  If you feel like you may vomit, or you vomit: ? Eat 4 or 5 small meals a day instead of 3 large meals. ? Try eating a few soda crackers. ? Drink liquids between meals instead of  during meals.  You may need to take these actions to prevent or treat trouble pooping: ? Drink enough fluids to keep your pee (urine) pale yellow. ? Eat foods that are high in fiber. These include beans, whole grains, and fresh fruits and vegetables. ? Limit foods that are high in fat and sugar. These include fried or sweet foods. Activity  Exercise only as told by your doctor. Most people can do their usual exercise routine during pregnancy.  Stop exercising if you have cramps or pain in your lower belly (abdomen) or low back.  Do not exercise if it is too hot or too humid, or if you are in a place of great height (high altitude).  Avoid heavy lifting.  If you choose to, you may have sex unless your doctor tells you not to. Relieving pain and discomfort  Wear a good support bra if your breasts are sore.  Rest with your legs raised (elevated) if you have leg cramps or low back pain.  If you have bulging veins (varicose veins) in your legs: ? Wear support hose as told by your doctor. ? Raise your feet for 15 minutes, 3-4 times a day. ? Limit salt in your food. Safety  Wear your seat belt at all times when you are in a car.  Talk with your doctor if someone is hurting you or yelling at you.  Talk with your doctor if you are feeling sad or have thoughts of hurting yourself. Lifestyle  Do not use hot tubs, steam rooms, or saunas.  Do not douche. Do not use tampons or scented sanitary pads.  Do not use herbal medicines, illegal drugs, or medicines that are not approved by your doctor. Do not drink alcohol.  Do not smoke or use any products that contain nicotine or tobacco. If you need help quitting, ask your doctor.  Avoid cat litter boxes and soil that is used by cats. These carry germs that can cause harm to the baby and can cause a loss of your baby by miscarriage or stillbirth. General instructions  Keep all follow-up visits. This is important.  Ask for help if you  need counseling or if you need help with nutrition. Your doctor can give you advice or tell you where to go for help.  Visit your dentist. At home, brush your teeth with a soft toothbrush. Floss gently.  Write down your questions. Take them to your prenatal visits. Where to find more information  American Pregnancy Association: americanpregnancy.org  American College of Obstetricians and Gynecologists: www.acog.org  Office on Women's Health: womenshealth.gov/pregnancy Contact a doctor if:  You are dizzy.  You have a fever.  You have mild cramps or pressure in your lower belly.  You have a nagging pain in your belly area.  You continue to feel like you may vomit, you vomit, or you have watery poop (diarrhea) for 24 hours or longer.  You have a bad-smelling fluid coming from your vagina.  You have pain when you pee.  You are exposed to a disease that spreads from person to person, such as chickenpox, measles, Zika virus, HIV, or hepatitis. Get help right away if:  You have spotting or   bleeding from your vagina.  You have very bad belly cramping or pain.  You have shortness of breath or chest pain.  You have any kind of injury, such as from a fall or a car crash.  You have new or increased pain, swelling, or redness in an arm or leg. Summary  The first trimester of pregnancy starts on the first day of your last menstrual period until the end of week 12 (months 1 through 3).  Eat 4 or 5 small meals a day instead of 3 large meals.  Do not smoke or use any products that contain nicotine or tobacco. If you need help quitting, ask your doctor.  Keep all follow-up visits. This information is not intended to replace advice given to you by your health care provider. Make sure you discuss any questions you have with your health care provider. Document Revised: 05/18/2020 Document Reviewed: 03/24/2020 Elsevier Patient Education  2021 [employer].

## 2021-05-15 NOTE — MAU Provider Note (Signed)
History     CSN: [id number]  Arrival date and time: 05/15/21 0942   None     Chief Complaint  Patient presents with  . Dizziness  . Fatigue  . Cough  . Nasal Congestion  . Fever  . Headache  . Emesis   HPI Marie Williamson is a 30 y.o. G1P0 at 105w4d who presents to MAU with multiple complaints. She denies abdominal pain, vaginal bleeding, dysuria.  Fatigue, Nausea and Emesis These are new complaints, present "for a long time". Patient states she frequently has difficulty with low energy, especially in the morning. She "can't get out of bed", which is turn impacting her work and causing her to be late. She has difficulty tolerating solid PO intake and frequently subsists on watermelon and occasionally bland mashed potatoes. [facility] office prescribed Phenergan but patient is unable to tolerate pills. She does not have access to other medications or treatments.   Fever, Productive Cough, Sinus Congestion, Headache, Dizziness These are new complaints, all of which started yesterday afternoon. Patient does not have a thermomenter but states she was sweating through the night but also experienced chills and was "cold to the touch". She is managing her sinus congestion with Tylenol, most recently administered last night around 2200 hours. She endorses bilateral anterior headache, pain score 5/10 on arrival to MAU.  Patient has not received a COVID vaccination. She denies activity intolerance, SOB, chest pain, weakness, syncope.   She is scheduled for a New OB at [facility] on 05/23/2021.   OB History    Gravida  1   Para      Term      Preterm      AB      Living        SAB      IAB      Ectopic      Multiple      Live Births              Past Medical History:  Diagnosis Date  . Medical history non-contributory     Past Surgical History:  Procedure Laterality Date  . NO PAST SURGERIES      Social History   Tobacco Use  . Smoking status: Never Smoker  .  Smokeless tobacco: Never Used  Substance Use Topics  . Alcohol use: No  . Drug use: No    Allergies: No Known Allergies  Medications Prior to Admission  Medication Sig Dispense Refill Last Dose  . ibuprofen (CHILDRENS MOTRIN) 100 MG/5ML suspension Take 30 mLs (600 mg total) by mouth every 6 (six) hours as needed for moderate pain. (Patient not taking: Reported on 07/10/2018) 473 mL 0   . metroNIDAZOLE (METROGEL VAGINAL) 0.75 % vaginal gel Place 1 Applicatorful vaginally 2 (two) times daily. 70 g 0   . pantoprazole (PROTONIX) 20 MG tablet Take 1 tablet (20 mg total) by mouth daily. 30 tablet 0   . promethazine (PHENERGAN) 25 MG tablet Take 1 tablet (25 mg total) by mouth every 6 (six) hours as needed for nausea or vomiting. 30 tablet 0     Review of Systems  Constitutional: Positive for chills, fatigue and fever.  HENT: Positive for sinus pressure.   Gastrointestinal: Positive for nausea and vomiting.  Neurological: Positive for headaches.  All other systems reviewed and are negative.  Physical Exam   Blood pressure 120/78, pulse 79, temperature 98.7 F (37.1 C), temperature source Oral, resp. rate 18, height 5\' 7"  (1.702 m), weight )  137.4 kg, last menstrual period 03/02/2021, SpO2 100 %.  Physical Exam Vitals and nursing note reviewed.  Constitutional:      Appearance: She is obese.  HENT:     Mouth/Throat:     Lips: Pink.     Mouth: Mucous membranes are moist.     Pharynx: Oropharynx is clear.  Cardiovascular:     Rate and Rhythm: Normal rate.     Heart sounds: Normal heart sounds.  Pulmonary:     Effort: Pulmonary effort is normal.     Breath sounds: Normal breath sounds. No decreased breath sounds.  Abdominal:     Palpations: Abdomen is soft.  Skin:    Capillary Refill: Capillary refill takes less than 2 seconds.  Neurological:     Mental Status: She is alert and oriented to person, place, and time.  Psychiatric:        Mood and Affect: Mood normal.         Speech: Speech normal.        Behavior: Behavior normal.     MAU Course  Procedures  Orders Placed This Encounter  Procedures  . SARS CORONAVIRUS 2 (TAT 6-24 HRS) Nasopharyngeal Nasopharyngeal Swab  . Urinalysis, Routine w reflex microscopic Urine, Clean Catch  . CBC  . Comprehensive metabolic panel  . Discharge patient   Patient Vitals for the past 24 hrs:  BP Temp Temp src Pulse Resp SpO2 Height Weight  05/15/21 1300 104/67 -- -- -- 18 -- -- --  05/15/21 0959 120/78 98.7 F (37.1 C) Oral 79 18 100 % 5\' 7"  (1.702 m) (!) 137.4 kg  05/15/21 0957 -- -- -- -- -- 99 % -- --   Meds ordered this encounter  Medications  . acetaminophen (TYLENOL) tablet 1,000 mg  . loratadine (CLARITIN) tablet 10 mg  . ondansetron (ZOFRAN-ODT) disintegrating tablet 4 mg  . loratadine (CLARITIN) 10 MG tablet    Sig: Take 1 tablet (10 mg total) by mouth daily.    Dispense:  30 tablet    Refill:  0    Order Specific Question:   Supervising Provider    Answer:   05/17/21 [[id number]]  . acetaminophen (TYLENOL) 325 MG tablet    Sig: Take 2 tablets (650 mg total) by mouth every 4 (four) hours as needed.    Dispense:  180 tablet    Refill:  0    Order Specific Question:   Supervising Provider    Answer:   Adam Phenix [[id number]]  . ondansetron (ZOFRAN ODT) 4 MG disintegrating tablet    Sig: Take 1 tablet (4 mg total) by mouth every 6 (six) hours as needed for nausea.    Dispense:  20 tablet    Refill:  0    Nausea and vomiting in first trimester, if not relieved by Phenergan    Order Specific Question:   Supervising Provider    Answer:   Adam Phenix [[id number]]   Assessment and Plan  --30 y.o. G1P0 at [redacted]w[redacted]d  --Vomiting in first trimester --Mild ketonuria --New outpatient regimen for vomiting --Reviewed OTC treatments for symptom management --Headache resolved with PO Tylenol --COVID swab in work --Discharge home in stable condition  F/U: --New OB [facility] 05/31  6/31,  CNM 05/15/2021, 3:20 PM

## 2021-05-15 NOTE — MAU Note (Signed)
It all started last night.  Was hot, thought she had a fever, having sweats, but body was cold, did not check temperature. Took 1000mg  Tylenol. This morning, she felt dizzy with a HA and even more weak.  Still sweating.  Vomiting, runny nose, cough.

## 2021-05-23 ENCOUNTER — Other Ambulatory Visit: Payer: Self-pay

## 2021-05-23 ENCOUNTER — Ambulatory Visit (INDEPENDENT_AMBULATORY_CARE_PROVIDER_SITE_OTHER): Payer: 59 | Admitting: Certified Nurse Midwife

## 2021-05-23 ENCOUNTER — Other Ambulatory Visit (HOSPITAL_COMMUNITY)
Admission: RE | Admit: 2021-05-23 | Discharge: 2021-05-23 | Disposition: A | Discharge status: Home / Self Care | Payer: 59 | Source: Ambulatory Visit | Attending: Certified Nurse Midwife | Admitting: Certified Nurse Midwife

## 2021-05-23 ENCOUNTER — Encounter: Payer: Self-pay | Admitting: Certified Nurse Midwife

## 2021-05-23 VITALS — BP 139/92 | HR 56 | Wt 300.4 lb

## 2021-05-23 DIAGNOSIS — R030 Elevated blood-pressure reading, without diagnosis of hypertension: Secondary | ICD-10-CM | POA: Insufficient documentation

## 2021-05-23 DIAGNOSIS — Z3401 Encounter for supervision of normal first pregnancy, first trimester: Secondary | ICD-10-CM

## 2021-05-23 DIAGNOSIS — Z3A11 11 weeks gestation of pregnancy: Secondary | ICD-10-CM

## 2021-05-23 DIAGNOSIS — Z3400 Encounter for supervision of normal first pregnancy, unspecified trimester: Secondary | ICD-10-CM | POA: Insufficient documentation

## 2021-05-23 MED ORDER — BLOOD PRESSURE KIT DEVI: 1.0000 | Freq: Once | 0 refills | Status: AC

## 2021-05-23 NOTE — Patient Instructions (Signed)

## 2021-05-23 NOTE — Progress Notes (Signed)
History:   Marie Williamson is a 30 y.o. G1P0000 at 38w5dby LMP being seen today for her first obstetrical visit.  Her obstetrical history is significant for none. Patient does intend to breast feed. Pregnancy history fully reviewed.  Patient reports nausea, has meds prescribed but has a hard time keeping them down.   HISTORY: OB History  Gravida Para Term Preterm AB Living  1 0 0 0 0 0  SAB IAB Ectopic Multiple Live Births  0 0 0 0 0    # Outcome Date GA Lbr Len/2nd Weight Sex Delivery Anes PTL Lv  1 Current             Last pap smear was done unknown and was normal per pt  Past Medical History:  Diagnosis Date  . Medical history non-contributory    Past Surgical History:  Procedure Laterality Date  . NO PAST SURGERIES     Family History  Problem Relation Age of Onset  . Diabetes Mother   . Congestive Heart Failure Mother   . Cancer Mother   . Bipolar disorder Mother   . Depression Mother   . Stroke Maternal Grandmother   . Lung cancer Maternal Grandfather    Social History   Tobacco Use  . Smoking status: Never Smoker  . Smokeless tobacco: Never Used  Vaping Use  . Vaping Use: Never used  Substance Use Topics  . Alcohol use: Not Currently  . Drug use: No   No Known Allergies Current Outpatient Medications on File Prior to Visit  Medication Sig Dispense Refill  . acetaminophen (TYLENOL) 325 MG tablet Take 2 tablets (650 mg total) by mouth every 4 (four) hours as needed. 180 tablet 0  . loratadine (CLARITIN) 10 MG tablet Take 1 tablet (10 mg total) by mouth daily. 30 tablet 0  . ondansetron (ZOFRAN ODT) 4 MG disintegrating tablet Take 1 tablet (4 mg total) by mouth every 6 (six) hours as needed for nausea. 20 tablet 0  . promethazine (PHENERGAN) 25 MG tablet Take 1 tablet (25 mg total) by mouth every 6 (six) hours as needed for nausea or vomiting. (Patient not taking: Reported on 05/23/2021) 30 tablet 0   No current facility-administered medications on file  prior to visit.   Review of Systems Pertinent items noted in HPI and remainder of comprehensive ROS otherwise negative. Physical Exam:   Vitals:   05/23/21 0929  BP: (!) 139/92  Pulse: (!) 56  Weight: (!) 300 lb 6.4 oz (136.3 kg)   Fetal Heart Rate (bpm): 169  Constitutional: Well-developed, well-nourished pregnant female in no acute distress.  HEENT: PERRLA Skin: normal color and turgor, no rash Cardiovascular: normal rate & rhythm, no murmur Respiratory: normal effort, lung sounds clear throughout GI: Abd soft, non-tender, pos BS x 4 MS: Extremities nontender, no edema, normal ROM Neurologic: Alert and oriented x 4.  GU: no CVA tenderness Pelvic: NEFG, physiologic discharge, no blood, cervix clean, S=D. Pap/swabs collected  Assessment:    Pregnancy: G1P0000 Patient Active Problem List   Diagnosis Date Noted  . Supervision of low-risk first pregnancy 05/23/2021     Plan:    1. Encounter for supervision of low-risk first pregnancy in first trimester Doing well overall, not feeling fetal movement yet. Still having some nausea, advised she can place phenergan vaginally if it will not stay down. Also advised to begin eating immediately upon rising with use of ODT zofran if that's the only thing that works. Pt amenable to  plan - Blood Pressure Monitoring (BLOOD PRESSURE KIT) DEVI; 1 Device by Does not apply route once for 1 dose.  Dispense: 1 each; Refill: 0 - Genetic Screening - Culture, OB Urine - [facility] AMB BABYSCRIPTS SCHEDULE OPTIMIZATION - Cytology - PAP( [place]) - CBC/D/Plt+RPR+Rh+ABO+RubIgG... - Hemoglobin A1c - [place] MFM OB COMP + 14 WK; Future  2. [redacted] weeks gestation of pregnancy Routine new OB care including: - Initial labs drawn. - Continue prenatal vitamins. - Problem list reviewed and updated. - Genetic Screening discussed, First trimester screen, Quad screen and NIPS: ordered. - Ultrasound discussed; fetal anatomic survey: ordered. - Anticipatory guidance  about prenatal visits given including labs, ultrasounds, and testing. - Discussed usage of Babyscripts and virtual visits as additional source of managing and completing prenatal visits in midst of coronavirus and pandemic.   - Encouraged to complete MyChart Registration for her ability to review results, send requests, and have questions addressed.  - The nature of [facility] with multiple MDs and Advanced Practice Providers was explained to patient; also emphasized that residents, students are part of our team.  3. Elevated BP without diagnosis of hypertension  Pt states she has no history of HTN, appears nervous/excited - Comp Met (CMET) - Protein / creatinine ratio, urine  Routine obstetric precautions reviewed. Encouraged to seek out care at office or emergency room [facility] MAU preferred) for urgent and/or emergent concerns.  Return in about 4 weeks (around 06/20/2021) for IN-PERSON, LOB.     Gaylan Gerold, MSN, CNM, [place] Certified Nurse Midwife, [facility]

## 2021-05-24 ENCOUNTER — Encounter: Payer: Self-pay | Admitting: *Deleted

## 2021-05-24 LAB — CBC/D/PLT+RPR+RH+ABO+RUBIGG...
Antibody Screen: NEGATIVE
Basophils Absolute: 0 10*3/uL (ref 0.0–0.2)
Basos: 0 %
EOS (ABSOLUTE): 0.1 10*3/uL (ref 0.0–0.4)
Eos: 1 %
HCV Ab: <0.1 s/co ratio (ref 0.0–0.9)
HIV Screen 4th Generation wRfx: NONREACTIVE
Hematocrit: 39.2 % (ref 34.0–46.6)
Hemoglobin: 12.6 g/dL (ref 11.1–15.9)
Hepatitis B Surface Ag: NEGATIVE
Immature Grans (Abs): 0 10*3/uL (ref 0.0–0.1)
Immature Granulocytes: 0 %
Lymphocytes Absolute: 1.7 10*3/uL (ref 0.7–3.1)
Lymphs: 22 %
MCH: 28 pg (ref 26.6–33.0)
MCHC: 32.1 g/dL (ref 31.5–35.7)
MCV: 87 fL (ref 79–97)
Monocytes Absolute: 0.6 10*3/uL (ref 0.1–0.9)
Monocytes: 8 %
Neutrophils Absolute: 5.3 10*3/uL (ref 1.4–7.0)
Neutrophils: 69 %
Platelets: 278 10*3/uL (ref 150–450)
RBC: 4.5 x10E6/uL (ref 3.77–5.28)
RDW: 13.2 % (ref 11.7–15.4)
RPR Ser Ql: NONREACTIVE
Rh Factor: POSITIVE
Rubella Antibodies, IGG: 6.94 index (ref >0.99)
WBC: 7.8 10*3/uL (ref 3.4–10.8)

## 2021-05-24 LAB — COMPREHENSIVE METABOLIC PANEL
ALT: 20 IU/L (ref 0–32)
AST: 11 IU/L (ref 0–40)
Albumin/Globulin Ratio: 1.1 — ABNORMAL LOW (ref 1.2–2.2)
Albumin: 3.7 g/dL — ABNORMAL LOW (ref 3.9–5.0)
Alkaline Phosphatase: 75 IU/L (ref 44–121)
BUN/Creatinine Ratio: 12 (ref 9–23)
BUN: 7 mg/dL (ref 6–20)
Bilirubin Total: 0.2 mg/dL (ref 0.0–1.2)
CO2: 19 mmol/L — ABNORMAL LOW (ref 20–29)
Calcium: 9.1 mg/dL (ref 8.7–10.2)
Chloride: 99 mmol/L (ref 96–106)
Creatinine, Ser: 0.59 mg/dL (ref 0.57–1.00)
Globulin, Total: 3.4 g/dL (ref 1.5–4.5)
Glucose: 78 mg/dL (ref 65–99)
Potassium: 3.9 mmol/L (ref 3.5–5.2)
Sodium: 134 mmol/L (ref 134–144)
Total Protein: 7.1 g/dL (ref 6.0–8.5)
eGFR: 126 mL/min/{1.73_m2} (ref >59)

## 2021-05-24 LAB — CYTOLOGY - PAP
Chlamydia: NEGATIVE
Comment: NEGATIVE
Comment: NEGATIVE
Comment: NORMAL
Diagnosis: NEGATIVE
Neisseria Gonorrhea: NEGATIVE
Trichomonas: NEGATIVE

## 2021-05-24 LAB — PROTEIN / CREATININE RATIO, URINE
Creatinine, Urine: 354.4 mg/dL
Protein, Ur: 29.1 mg/dL
Protein/Creat Ratio: 82 mg/g creat (ref 0–200)

## 2021-05-24 LAB — HEMOGLOBIN A1C
Est. average glucose Bld gHb Est-mCnc: 100 mg/dL
Hgb A1c MFr Bld: 5.1 % (ref 4.8–5.6)

## 2021-05-24 LAB — HCV INTERPRETATION

## 2021-05-25 LAB — CULTURE, OB URINE

## 2021-05-25 LAB — URINE CULTURE, OB REFLEX: Organism ID, Bacteria: NO GROWTH

## 2021-05-31 ENCOUNTER — Encounter: Payer: Self-pay | Admitting: *Deleted

## 2021-06-01 ENCOUNTER — Inpatient Hospital Stay (HOSPITAL_COMMUNITY)
Admission: AD | Admit: 2021-06-01 | Discharge: 2021-06-01 | Disposition: A | Discharge status: Home / Self Care | Payer: Medicaid Other | Attending: Obstetrics & Gynecology | Admitting: Obstetrics & Gynecology

## 2021-06-01 DIAGNOSIS — O4692 Antepartum hemorrhage, unspecified, second trimester: Secondary | ICD-10-CM | POA: Diagnosis present

## 2021-06-01 DIAGNOSIS — Z3A13 13 weeks gestation of pregnancy: Secondary | ICD-10-CM | POA: Diagnosis not present

## 2021-06-01 DIAGNOSIS — O4691 Antepartum hemorrhage, unspecified, first trimester: Secondary | ICD-10-CM

## 2021-06-01 NOTE — MAU Provider Note (Addendum)
Chief Complaint:  Vaginal Bleeding   Seen by provider at 0200   HPI: Marie Williamson is a 30 y.o. G1P0000 at 60w0dwho presents to maternity admissions reporting seeing blood on tissue tonight.  No cramping or needing to wear a pad.  Blood was brownish red. .Has appt soon in office.  [place] in July.  She denies LOF, vaginal itching/burning, urinary symptoms, h/a, dizziness, n/v, diarrhea, constipation or fever/chills.  She denies headache, visual changes or RUQ abdominal pain  Vaginal Bleeding The patient's primary symptoms include vaginal bleeding. The patient's pertinent negatives include no genital itching, genital lesions, genital odor or pelvic pain. This is a new problem. The current episode started today. The problem has been rapidly improving. The patient is experiencing no pain. She is pregnant. Pertinent negatives include no abdominal pain, back pain, chills, constipation, diarrhea, dysuria, fever, headaches, nausea or vomiting. The vaginal discharge was bloody. The vaginal bleeding is lighter than menses. She has not been passing clots. She has not been passing tissue. Nothing aggravates the symptoms. She has tried nothing for the symptoms.  .  RN Note: Pt c/o of blood of tissue paper when she wiped after using the restroom, no clots. Denies pain  Past Medical History: Past Medical History:  Diagnosis Date   Medical history non-contributory     Past obstetric history: OB History  Gravida Para Term Preterm AB Living  1 0 0 0 0 0  SAB IAB Ectopic Multiple Live Births  0 0 0 0 0    # Outcome Date GA Lbr Len/2nd Weight Sex Delivery Anes PTL Lv  1 Current             Past Surgical History: Past Surgical History:  Procedure Laterality Date   NO PAST SURGERIES      Family History: Family History  Problem Relation Age of Onset   Diabetes Mother    Congestive Heart Failure Mother    Cancer Mother    Bipolar disorder Mother    Depression Mother    Stroke Maternal Grandmother     Lung cancer Maternal Grandfather     Social History: Social History   Tobacco Use   Smoking status: Never   Smokeless tobacco: Never  Vaping Use   Vaping Use: Never used  Substance Use Topics   Alcohol use: Not Currently   Drug use: No    Allergies: No Known Allergies  Meds:  Medications Prior to Admission  Medication Sig Dispense Refill Last Dose   acetaminophen (TYLENOL) 325 MG tablet Take 2 tablets (650 mg total) by mouth every 4 (four) hours as needed. 180 tablet 0    loratadine (CLARITIN) 10 MG tablet Take 1 tablet (10 mg total) by mouth daily. 30 tablet 0    ondansetron (ZOFRAN ODT) 4 MG disintegrating tablet Take 1 tablet (4 mg total) by mouth every 6 (six) hours as needed for nausea. 20 tablet 0    promethazine (PHENERGAN) 25 MG tablet Take 1 tablet (25 mg total) by mouth every 6 (six) hours as needed for nausea or vomiting. 30 tablet 0     I have reviewed patient's Past Medical Hx, Surgical Hx, Family Hx, Social Hx, medications and allergies.   ROS:  Review of Systems  Constitutional:  Negative for chills and fever.  Gastrointestinal:  Negative for abdominal pain, constipation, diarrhea, nausea and vomiting.  Genitourinary:  Positive for vaginal bleeding. Negative for dysuria and pelvic pain.  Musculoskeletal:  Negative for back pain.  Neurological:  Negative  for headaches.  Other systems negative  Physical Exam  Patient Vitals for the past 24 hrs:  SpO2  06/01/21 0140 100 %   Constitutional: Well-developed, well-nourished female in no acute distress.  Cardiovascular: normal rate and rhythm Respiratory: normal effort GI: Abd soft, non-tender, gravid appropriate for gestational age.   No rebound or guarding. MS: Extremities nontender, no edema, normal ROM Neurologic: Alert and oriented x 4.  GU: Neg CVAT.  PELVIC EXAM: Cervix pink, visually closed, without lesion, scant dark red discharge, vaginal walls and external genitalia normal   FHT:  160s per  bedside US   Labs: None indicated A/Positive/-- (05/31 1029)  Imaging:  Bedside US  Pt informed that the ultrasound is considered a limited OB ultrasound and is not intended to be a complete ultrasound exam.  Patient also informed that the ultrasound is not being completed with the intent of assessing for fetal or placental anomalies or any pelvic abnormalities.  Explained that the purpose of today's ultrasound is to assess for viability.  Patient acknowledges the purpose of the exam and the limitations of the study.    Single Gestational sac visible with viable live fetus.  Fetal movement seen Fetal heart rate 160s Normal amniotic fluid visible CRL [redacted]w[redacted]d  MAU Course/MDM: I have reviewed blood type which is Rh+, no need for rhophylac   Treatments in MAU included bedside US  Discussed this likely represents a small SCH.  The color of the blood indicates and older bleed, so given that we see a vigorous fetus, it is likely not significant in amount or size. .    Assessment: 1. Vaginal bleeding in pregnancy, second trimester     Plan: Discharge home Pelvic rest Follow up in Office for prenatal visits and recheck of bleeding   Follow-up Information     [facility] Follow up.   Specialty: Obstetrics and Gynecology Contact information: [address] [place] [address] [phone]              Encouraged to return if she develops worsening of symptoms, increase in pain, fever, or other concerning symptoms.  Pt stable at time of discharge.  Wynelle Bourgeois CNM, MSN Certified Nurse-Midwife 06/01/2021 2:33 AM

## 2021-06-01 NOTE — MAU Note (Addendum)
Pt c/o of blood of tissue paper when she wiped after using the restroom, no clots. Denies pain

## 2021-06-06 ENCOUNTER — Other Ambulatory Visit: Payer: Self-pay

## 2021-06-06 ENCOUNTER — Encounter (HOSPITAL_COMMUNITY): Payer: Self-pay | Admitting: Obstetrics & Gynecology

## 2021-06-06 ENCOUNTER — Inpatient Hospital Stay (HOSPITAL_COMMUNITY)
Admission: AD | Admit: 2021-06-06 | Discharge: 2021-06-06 | Disposition: A | Discharge status: Home / Self Care | Payer: Medicaid Other | Attending: Obstetrics & Gynecology | Admitting: Obstetrics & Gynecology

## 2021-06-06 DIAGNOSIS — O4692 Antepartum hemorrhage, unspecified, second trimester: Secondary | ICD-10-CM

## 2021-06-06 DIAGNOSIS — O98511 Other viral diseases complicating pregnancy, first trimester: Secondary | ICD-10-CM

## 2021-06-06 DIAGNOSIS — U071 COVID-19: Secondary | ICD-10-CM | POA: Diagnosis not present

## 2021-06-06 DIAGNOSIS — Z3A13 13 weeks gestation of pregnancy: Secondary | ICD-10-CM | POA: Diagnosis not present

## 2021-06-06 DIAGNOSIS — Z3401 Encounter for supervision of normal first pregnancy, first trimester: Secondary | ICD-10-CM

## 2021-06-06 DIAGNOSIS — Z8616 Personal history of COVID-19: Secondary | ICD-10-CM | POA: Diagnosis present

## 2021-06-06 LAB — URINALYSIS, ROUTINE W REFLEX MICROSCOPIC
Bilirubin Urine: NEGATIVE
Glucose, UA: NEGATIVE mg/dL
Hgb urine dipstick: NEGATIVE
Ketones, ur: NEGATIVE mg/dL
Leukocytes,Ua: NEGATIVE
Nitrite: NEGATIVE
Protein, ur: NEGATIVE mg/dL
Specific Gravity, Urine: 1.009 (ref 1.005–1.030)
pH: 6 (ref 5.0–8.0)

## 2021-06-06 MED ORDER — FLUTICASONE PROPIONATE 50 MCG/ACT NA SUSP: 2.0000 | Freq: Two times a day (BID) | NASAL | 2 refills | Status: DC

## 2021-06-06 MED ORDER — ACETAMINOPHEN 325 MG PO TABS
650.0000 mg | ORAL_TABLET | Freq: Once | ORAL | Status: AC
  Administered 2021-06-06: 650 mg via ORAL
  Filled 2021-06-06: qty 2

## 2021-06-06 MED ORDER — GUAIFENESIN ER 600 MG PO TB12: 600.0000 mg | ORAL_TABLET | Freq: Two times a day (BID) | ORAL | 1 refills | Status: DC

## 2021-06-06 NOTE — MAU Provider Note (Signed)
Chief Complaint: Generalized Body Aches   Event Date/Time   First Provider Initiated Contact with Patient 06/06/21 2204        SUBJECTIVE HPI: Marie Williamson is a 30 y.o. G1P0000 at [redacted]w[redacted]d by LMP who presents to maternity admissions reporting testing positive for Covid on a home test tonight.  Has complaints of nasal congestion, weakness, aches, headache and fever.  Had one episode of vomiting in AM. 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 . She denies vaginal bleeding, urinary symptoms, dizziness    States had Covid in November and was more ill that time  Fever  This is a new problem. The current episode started today. The problem has been unchanged. Her temperature was unmeasured prior to arrival. Associated symptoms include congestion, headaches, muscle aches, nausea and vomiting. Pertinent negatives include no abdominal pain, diarrhea, sore throat, urinary pain or wheezing. She has tried acetaminophen for the symptoms. The treatment provided mild relief.  Risk factors: sick contacts    Marie Williamson Note: PT SAYS YESTERDAY - SHE FELT HOT AND NASAL CONGESTION, AND H/A AND BODY ACHES THEN TODAY -FELT WEAK AND ACHY ( TOOK XS 2 TABS TYLENOL AT 1PM ) DID AT HOME COVID TEST- AT 6 PM- WAS POSITIVE - TEMP AT WORK WAS 98.  VOMITED AT 11AM.  HAS ONLY DRANK 1 BOTTLE OF WATER , 1 CUP OF LEMONADE , 16OZ OF APPLE JUICE  Past Medical History:  Diagnosis Date   Medical history non-contributory    Past Surgical History:  Procedure Laterality Date   NO PAST SURGERIES     Social History   Socioeconomic History   Marital status: Single    Spouse name: Not on file   Number of children: Not on file   Years of education: Not on file   Highest education level: Not on file  Occupational History   Not on file  Tobacco Use   Smoking status: Never   Smokeless tobacco: Never  Vaping Use   Vaping Use: Never used  Substance and Sexual Activity   Alcohol use: Not Currently   Drug  use: No   Sexual activity: Yes  Other Topics Concern   Not on file  Social History Narrative   Not on file   Social Determinants of Health   Financial Resource Strain: Not on file  Food Insecurity: No Food Insecurity   Worried About Running Out of Food in the Last Year: Never true   Ran Out of Food in the Last Year: Never true  Transportation Needs: No Transportation Needs   Lack of Transportation (Medical): No   Lack of Transportation (Non-Medical): No  Physical Activity: Not on file  Stress: Not on file  Social Connections: Not on file  Intimate Partner Violence: Not on file   No current facility-administered medications on file prior to encounter.   Current Outpatient Medications on File Prior to Encounter  Medication Sig Dispense Refill   acetaminophen (TYLENOL) 325 MG tablet Take 2 tablets (650 mg total) by mouth every 4 (four) hours as needed. 180 tablet 0   loratadine (CLARITIN) 10 MG tablet Take 1 tablet (10 mg total) by mouth daily. 30 tablet 0   ondansetron (ZOFRAN ODT) 4 MG disintegrating tablet Take 1 tablet (4 mg total) by mouth every 6 (six) hours as needed for nausea. 20 tablet 0   promethazine (PHENERGAN) 25 MG tablet Take 1 tablet (25 mg total) by mouth every 6 (six) hours as needed for  nausea or vomiting. 30 tablet 0   No Known Allergies  I have reviewed patient's Past Medical Hx, Surgical Hx, Family Hx, Social Hx, medications and allergies.   ROS:  Review of Systems  Constitutional:  Positive for fever.  HENT:  Positive for congestion. Negative for sore throat.   Respiratory:  Negative for wheezing.   Gastrointestinal:  Positive for nausea and vomiting. Negative for abdominal pain and diarrhea.  Genitourinary:  Negative for dysuria.  Neurological:  Positive for headaches.  Review of Systems  Other systems negative   Physical Exam  Physical Exam Patient Vitals for the past 24 hrs:  BP Temp Temp src Pulse Resp Height Weight  06/06/21 2148 116/69 (!)  100.5 F (38.1 C) Oral 85 20 5\' 7"  (1.702 m) (!) 136.8 kg   Constitutional: Well-developed, well-nourished female in no acute distress.  Cardiovascular: normal rate, S1S2 audible Respiratory: normal effort, Clear to auscultation bilaterallly   Oxygen sats 99% GI: Abd soft, non-tender. MS: Extremities nontender, no edema, normal ROM Neurologic: Alert and oriented x 4.  GU: Neg CVAT.  LAB RESULTS Results for orders placed or performed during the hospital encounter of 06/06/21 (from the past 24 hour(s))  Urinalysis, Routine w reflex microscopic Urine, Clean Catch     Status: None   Collection Time: 06/06/21 10:13 PM  Result Value Ref Range   Color, Urine YELLOW YELLOW   APPearance CLEAR CLEAR   Specific Gravity, Urine 1.009 1.005 - 1.030   pH 6.0 5.0 - 8.0   Glucose, UA NEGATIVE NEGATIVE mg/dL   Hgb urine dipstick NEGATIVE NEGATIVE   Bilirubin Urine NEGATIVE NEGATIVE   Ketones, ur NEGATIVE NEGATIVE mg/dL   Protein, ur NEGATIVE NEGATIVE mg/dL   Nitrite NEGATIVE NEGATIVE   Leukocytes,Ua NEGATIVE NEGATIVE   A/Positive/-- (05/31 1029)  IMAGING No results found.  MAU Management/MDM: Physical exam WNL.  Does have mild fever but is well oxygenated (maintained Saturations even with ambulation).   Seen 4 days ago for bleeding and exam showed viable fetus Discussed care is usually supportive at this point SInce she is well oxygenated, will not get CXR now.   Will discharge home on Flonase (to help reduce chance of progression to severe) and Mucinex.  Suggest Tylenol for fever and aches Push fluids for hydration  ASSESSMENT SIngle IUP at [redacted]w[redacted]d Covid  PLAN Discharge home Rx Flonase for covid Rx Mucinex for cough PO hydration Supportive care and isolation  Pt stable at time of discharge. Encouraged to return here if she develops worsening of symptoms, increase in pain, fever, or other concerning symptoms.    [redacted]w[redacted]d CNM, MSN Certified Nurse-Midwife 06/06/2021  10:04  PM

## 2021-06-06 NOTE — MAU Note (Addendum)
PT SAYS YESTERDAY - SHE FELT HOT AND NASAL CONGESTION, AND H/A AND BODY ACHES  THEN TODAY -FELT WEAK AND ACHY ( TOOK XS 2 TABS TYLENOL AT 1PM )  DID AT HOME COVID TEST- AT 6 PM- WAS POSITIVE -  TEMP AT WORK WAS 98.  VOMITED AT 11AM.   HAS ONLY DRANK 1 BOTTLE OF WATER , 1 CUP OF LEMONADE , 16OZ OF APPLE JUICE

## 2021-06-07 DIAGNOSIS — Z8616 Personal history of COVID-19: Secondary | ICD-10-CM | POA: Diagnosis present

## 2021-06-23 ENCOUNTER — Encounter: Payer: Self-pay | Admitting: *Deleted

## 2021-06-23 ENCOUNTER — Encounter: Payer: Medicaid Other | Admitting: Medical

## 2021-06-30 ENCOUNTER — Other Ambulatory Visit: Payer: Self-pay

## 2021-06-30 ENCOUNTER — Ambulatory Visit (INDEPENDENT_AMBULATORY_CARE_PROVIDER_SITE_OTHER): Payer: Medicaid Other | Admitting: Family

## 2021-06-30 VITALS — BP 121/80 | HR 67 | Wt 302.7 lb

## 2021-06-30 DIAGNOSIS — Z5941 Food insecurity: Secondary | ICD-10-CM

## 2021-06-30 DIAGNOSIS — Z3A17 17 weeks gestation of pregnancy: Secondary | ICD-10-CM

## 2021-06-30 DIAGNOSIS — Z3402 Encounter for supervision of normal first pregnancy, second trimester: Secondary | ICD-10-CM | POA: Insufficient documentation

## 2021-06-30 DIAGNOSIS — F32A Depression, unspecified: Secondary | ICD-10-CM

## 2021-06-30 NOTE — Progress Notes (Signed)
   PRENATAL VISIT NOTE  Subjective:  Marie Williamson is a 30 y.o. G1P0000 at [redacted]w[redacted]d being seen today for ongoing prenatal care.  She is currently monitored for the following issues for this low-risk pregnancy and has Supervision of low-risk first pregnancy; Elevated blood pressure reading without diagnosis of hypertension; Antepartum bleeding, second trimester; COVID; and Encounter for supervision of normal first pregnancy in second trimester on their problem list.  Patient reports no complaints.  Contractions: Not present. Vag. Bleeding: None.  Movement: Present. Denies leaking of fluid.   The following portions of the patient's history were reviewed and updated as appropriate: allergies, current medications, past family history, past medical history, past social history, past surgical history and problem list.   Objective:   Vitals:   06/30/21 1105  BP: 121/80  Pulse: 67  Weight: (!) 302 lb 11.2 oz (137.3 kg)    Fetal Status: Fetal Heart Rate (bpm): 158   Movement: Present     General:  Alert, oriented and cooperative. Patient is in no acute distress.  Skin: Skin is warm and dry. No rash noted.   Cardiovascular: Normal heart rate noted  Respiratory: Normal respiratory effort, no problems with respiration noted  Abdomen: Soft, gravid, appropriate for gestational age.  Pain/Pressure: Absent     Pelvic: Cervical exam deferred        Extremities: Normal range of motion.  Edema: None  Mental Status: Normal mood and affect. Normal behavior. Normal judgment and thought content.   Assessment and Plan:  Pregnancy: G1P0000 at [redacted]w[redacted]d 1. Depression, unspecified depression type - Ambulatory referral to Integrated Behavioral Health  2. Food insecurity - AMBULATORY REFERRAL TO [facility]  3. Encounter for supervision of normal first pregnancy in second trimester - Reviewed fluttering - Reviewed new ob labs and 2nd trimester expectations - Anatomy ultrasound scheduled  4. [redacted] weeks  gestation of pregnancy - Reviewed warning signs of pregnancy   Return in about 4 weeks (around 07/28/2021).  Future Appointments  Date Time Provider Department Center  07/13/2021  8:45 AM [facility] US5 [facility]    [id number] Eloisa Northern, CNM

## 2021-07-13 ENCOUNTER — Other Ambulatory Visit: Payer: Self-pay | Admitting: Certified Nurse Midwife

## 2021-07-13 ENCOUNTER — Ambulatory Visit: Payer: Medicaid Other | Attending: Certified Nurse Midwife

## 2021-07-13 ENCOUNTER — Other Ambulatory Visit: Payer: Self-pay | Admitting: *Deleted

## 2021-07-13 ENCOUNTER — Other Ambulatory Visit: Payer: Self-pay

## 2021-07-13 DIAGNOSIS — O99212 Obesity complicating pregnancy, second trimester: Secondary | ICD-10-CM | POA: Diagnosis not present

## 2021-07-13 DIAGNOSIS — Z8616 Personal history of COVID-19: Secondary | ICD-10-CM | POA: Insufficient documentation

## 2021-07-13 DIAGNOSIS — O3482 Maternal care for other abnormalities of pelvic organs, second trimester: Secondary | ICD-10-CM | POA: Insufficient documentation

## 2021-07-13 DIAGNOSIS — Z3401 Encounter for supervision of normal first pregnancy, first trimester: Secondary | ICD-10-CM

## 2021-07-13 DIAGNOSIS — O43192 Other malformation of placenta, second trimester: Secondary | ICD-10-CM | POA: Diagnosis not present

## 2021-07-13 DIAGNOSIS — Z363 Encounter for antenatal screening for malformations: Secondary | ICD-10-CM | POA: Insufficient documentation

## 2021-07-13 DIAGNOSIS — N83291 Other ovarian cyst, right side: Secondary | ICD-10-CM | POA: Insufficient documentation

## 2021-07-13 DIAGNOSIS — O2692 Pregnancy related conditions, unspecified, second trimester: Secondary | ICD-10-CM

## 2021-07-13 DIAGNOSIS — O43199 Other malformation of placenta, unspecified trimester: Secondary | ICD-10-CM

## 2021-07-13 DIAGNOSIS — Z3A19 19 weeks gestation of pregnancy: Secondary | ICD-10-CM | POA: Diagnosis not present

## 2021-07-14 ENCOUNTER — Encounter: Payer: Self-pay | Admitting: Obstetrics and Gynecology

## 2021-07-14 DIAGNOSIS — O43199 Other malformation of placenta, unspecified trimester: Secondary | ICD-10-CM | POA: Insufficient documentation

## 2021-07-27 ENCOUNTER — Ambulatory Visit: Payer: Medicaid Other | Attending: Obstetrics and Gynecology

## 2021-07-27 ENCOUNTER — Ambulatory Visit: Payer: Medicaid Other | Admitting: *Deleted

## 2021-07-27 ENCOUNTER — Other Ambulatory Visit: Payer: Self-pay | Admitting: Obstetrics and Gynecology

## 2021-07-27 ENCOUNTER — Encounter: Payer: Self-pay | Admitting: *Deleted

## 2021-07-27 ENCOUNTER — Other Ambulatory Visit: Payer: Self-pay

## 2021-07-27 VITALS — BP 110/58 | HR 61

## 2021-07-27 DIAGNOSIS — O4692 Antepartum hemorrhage, unspecified, second trimester: Secondary | ICD-10-CM | POA: Diagnosis present

## 2021-07-27 DIAGNOSIS — Z3686 Encounter for antenatal screening for cervical length: Secondary | ICD-10-CM

## 2021-07-27 DIAGNOSIS — O43199 Other malformation of placenta, unspecified trimester: Secondary | ICD-10-CM | POA: Insufficient documentation

## 2021-07-27 DIAGNOSIS — O43192 Other malformation of placenta, second trimester: Secondary | ICD-10-CM

## 2021-07-27 DIAGNOSIS — O26892 Other specified pregnancy related conditions, second trimester: Secondary | ICD-10-CM

## 2021-07-27 DIAGNOSIS — O99342 Other mental disorders complicating pregnancy, second trimester: Secondary | ICD-10-CM

## 2021-07-27 DIAGNOSIS — Z3A21 21 weeks gestation of pregnancy: Secondary | ICD-10-CM

## 2021-07-27 DIAGNOSIS — O99212 Obesity complicating pregnancy, second trimester: Secondary | ICD-10-CM

## 2021-07-27 DIAGNOSIS — Z8616 Personal history of COVID-19: Secondary | ICD-10-CM

## 2021-07-27 DIAGNOSIS — R030 Elevated blood-pressure reading, without diagnosis of hypertension: Secondary | ICD-10-CM

## 2021-07-28 ENCOUNTER — Ambulatory Visit (INDEPENDENT_AMBULATORY_CARE_PROVIDER_SITE_OTHER): Payer: Medicaid Other | Admitting: Family Medicine

## 2021-07-28 ENCOUNTER — Encounter: Payer: Self-pay | Admitting: Family Medicine

## 2021-07-28 VITALS — BP 115/78 | HR 65 | Wt 302.1 lb

## 2021-07-28 DIAGNOSIS — O43199 Other malformation of placenta, unspecified trimester: Secondary | ICD-10-CM

## 2021-07-28 DIAGNOSIS — U071 COVID-19: Secondary | ICD-10-CM

## 2021-07-28 DIAGNOSIS — Z3402 Encounter for supervision of normal first pregnancy, second trimester: Secondary | ICD-10-CM

## 2021-07-28 DIAGNOSIS — R030 Elevated blood-pressure reading, without diagnosis of hypertension: Secondary | ICD-10-CM

## 2021-07-28 NOTE — Patient Instructions (Signed)

## 2021-07-28 NOTE — Progress Notes (Signed)
   Subjective:  SHATONIA HOOTS is a 30 y.o. G1P0000 at [redacted]w[redacted]d being seen today for ongoing prenatal care.  She is currently monitored for the following issues for this low-risk pregnancy and has Supervision of low-risk first pregnancy; Elevated blood pressure reading without diagnosis of hypertension; Antepartum bleeding, second trimester; COVID; Encounter for supervision of normal first pregnancy in second trimester; and Marginal insertion of umbilical cord affecting management of mother on their problem list.  Patient reports no complaints.  Contractions: Not present. Vag. Bleeding: None.  Movement: Present. Denies leaking of fluid.   The following portions of the patient's history were reviewed and updated as appropriate: allergies, current medications, past family history, past medical history, past social history, past surgical history and problem list. Problem list updated.  Objective:   Vitals:   07/28/21 0847  BP: 115/78  Pulse: 65  Weight: (!) 302 lb 1.6 oz (137 kg)    Fetal Status: Fetal Heart Rate (bpm): 148   Movement: Present     General:  Alert, oriented and cooperative. Patient is in no acute distress.  Skin: Skin is warm and dry. No rash noted.   Cardiovascular: Normal heart rate noted  Respiratory: Normal respiratory effort, no problems with respiration noted  Abdomen: Soft, gravid, appropriate for gestational age. Pain/Pressure: Present     Pelvic: Vag. Bleeding: None     Cervical exam deferred        Extremities: Normal range of motion.  Edema: None  Mental Status: Normal mood and affect. Normal behavior. Normal judgment and thought content.   Urinalysis:      Assessment and Plan:  Pregnancy: G1P0000 at [redacted]w[redacted]d  1. Encounter for supervision of low-risk first pregnancy in second trimester BP and FHR normal AFP not obtained last visit due to concerns about dating, collected today  2. Marginal insertion of umbilical cord affecting management of mother Following w  MFM, normal growth to date  3. COVID Diagnosed 05/2021 Discussed vaccine, strongly urged her to consider vaccine series now that she is recovered  4. Elevated blood pressure reading without diagnosis of hypertension At new OB visit, review of chart shows no prior hx of elevated BP's Baseline labs unremarkable  Preterm labor symptoms and general obstetric precautions including but not limited to vaginal bleeding, contractions, leaking of fluid and fetal movement were reviewed in detail with the patient. Please refer to After Visit Summary for other counseling recommendations.  Return in 4 weeks (on 08/25/2021) for ob visit.   Venora Maples, MD

## 2021-08-04 LAB — AFP, SERUM, OPEN SPINA BIFIDA
AFP MoM: 1.2
AFP Value: 56 ng/mL
Gest. Age on Collection Date: 21.1 weeks
Maternal Age At EDD: 29.4 yr
OSBR Risk 1 IN: 6499
Test Results:: NEGATIVE
Weight: 302 [lb_av]

## 2021-08-10 ENCOUNTER — Other Ambulatory Visit: Payer: Self-pay

## 2021-08-10 ENCOUNTER — Ambulatory Visit: Payer: Medicaid Other | Attending: Obstetrics

## 2021-08-10 ENCOUNTER — Other Ambulatory Visit: Payer: Self-pay | Admitting: *Deleted

## 2021-08-10 ENCOUNTER — Ambulatory Visit: Payer: Medicaid Other | Admitting: *Deleted

## 2021-08-10 VITALS — BP 105/64 | HR 60

## 2021-08-10 DIAGNOSIS — O43192 Other malformation of placenta, second trimester: Secondary | ICD-10-CM | POA: Diagnosis not present

## 2021-08-10 DIAGNOSIS — O43199 Other malformation of placenta, unspecified trimester: Secondary | ICD-10-CM

## 2021-08-10 DIAGNOSIS — R030 Elevated blood-pressure reading, without diagnosis of hypertension: Secondary | ICD-10-CM | POA: Insufficient documentation

## 2021-08-10 DIAGNOSIS — Z362 Encounter for other antenatal screening follow-up: Secondary | ICD-10-CM

## 2021-08-10 DIAGNOSIS — Z3A23 23 weeks gestation of pregnancy: Secondary | ICD-10-CM | POA: Diagnosis not present

## 2021-08-10 DIAGNOSIS — O2692 Pregnancy related conditions, unspecified, second trimester: Secondary | ICD-10-CM | POA: Diagnosis not present

## 2021-08-10 DIAGNOSIS — O4692 Antepartum hemorrhage, unspecified, second trimester: Secondary | ICD-10-CM | POA: Insufficient documentation

## 2021-08-10 DIAGNOSIS — O99212 Obesity complicating pregnancy, second trimester: Secondary | ICD-10-CM

## 2021-08-10 DIAGNOSIS — Z3402 Encounter for supervision of normal first pregnancy, second trimester: Secondary | ICD-10-CM | POA: Diagnosis present

## 2021-08-10 DIAGNOSIS — Z8616 Personal history of COVID-19: Secondary | ICD-10-CM | POA: Diagnosis not present

## 2021-08-10 DIAGNOSIS — O322XX0 Maternal care for transverse and oblique lie, not applicable or unspecified: Secondary | ICD-10-CM

## 2021-08-10 DIAGNOSIS — O99342 Other mental disorders complicating pregnancy, second trimester: Secondary | ICD-10-CM

## 2021-08-10 DIAGNOSIS — Z6841 Body Mass Index (BMI) 40.0 and over, adult: Secondary | ICD-10-CM

## 2021-08-24 ENCOUNTER — Ambulatory Visit (INDEPENDENT_AMBULATORY_CARE_PROVIDER_SITE_OTHER): Payer: Medicaid Other | Admitting: Family Medicine

## 2021-08-24 ENCOUNTER — Other Ambulatory Visit: Payer: Self-pay

## 2021-08-24 ENCOUNTER — Encounter: Payer: Self-pay | Admitting: Family Medicine

## 2021-08-24 VITALS — BP 121/67 | HR 64 | Wt 304.9 lb

## 2021-08-24 DIAGNOSIS — O43199 Other malformation of placenta, unspecified trimester: Secondary | ICD-10-CM

## 2021-08-24 DIAGNOSIS — Z3402 Encounter for supervision of normal first pregnancy, second trimester: Secondary | ICD-10-CM

## 2021-08-24 DIAGNOSIS — Z3A25 25 weeks gestation of pregnancy: Secondary | ICD-10-CM

## 2021-08-24 DIAGNOSIS — Z6841 Body Mass Index (BMI) 40.0 and over, adult: Secondary | ICD-10-CM

## 2021-08-24 NOTE — Progress Notes (Signed)
    Subjective:  Marie Williamson is a 30 y.o. G1P0000 at [redacted]w[redacted]d being seen today for ongoing prenatal care.  She is currently monitored for the following issues for this low-risk pregnancy and has Supervision of low-risk first pregnancy; Elevated blood pressure reading without diagnosis of hypertension; Antepartum bleeding, second trimester; COVID; Encounter for supervision of normal first pregnancy in second trimester; and Marginal insertion of umbilical cord affecting management of mother on their problem list.  Patient reports no complaints.  Contractions: Not present. Vag. Bleeding: None.  Movement: Present. Denies leaking of fluid.   The following portions of the patient's history were reviewed and updated as appropriate: allergies, current medications, past family history, past medical history, past social history, past surgical history and problem list.   Objective:   Vitals:   08/24/21 1036  BP: 121/67  Pulse: 64  Weight: (!) 304 lb 14.4 oz (138.3 kg)    Fetal Status: Fetal Heart Rate (bpm): 154 Fundal Height: 25 cm Movement: Present     General:  Alert, oriented and cooperative. Patient is in no acute distress.  Skin: Skin is warm and dry. No rash noted.   Cardiovascular: Normal heart rate noted  Respiratory: Normal respiratory effort, no problems with respiration noted  Abdomen: Soft, gravid, appropriate for gestational age. Pain/Pressure: Present     Pelvic:  Cervical exam deferred        Extremities: Normal range of motion.  Edema: None  Mental Status: Normal mood and affect. Normal behavior. Normal judgment and thought content.    Assessment and Plan:  Pregnancy: G1P0000 at [redacted]w[redacted]d  1. Encounter for supervision of low-risk first pregnancy in second trimester Doing well. Discussed contraception options, desires non-hormonal, looking into copper IUD Vs phexxi further.   2. Marginal insertion of umbilical cord affecting management of mother Q4 week fetal growth testing per  MFM, next on 9/19  3. [redacted] weeks gestation of pregnancy Counseled that 28 week lab and GTT will be collected next visit.   4. BMI 45.0-49.9, adult (HCC) Difficult to assess fundal height. Weekly testing starting at 34 weeks per MFM.   Preterm labor symptoms and general obstetric precautions including but not limited to vaginal bleeding, contractions, leaking of fluid and fetal movement were reviewed in detail with the patient. Please refer to After Visit Summary for other counseling recommendations.   Return in about 3 weeks (around 09/14/2021) for LROB with GTT.   Allayne Stack, DO

## 2021-08-24 NOTE — Patient Instructions (Signed)
Phexxi: non-hormonal as needed birth control

## 2021-08-24 NOTE — Progress Notes (Signed)
Patient declines flu shot  [place], [place]

## 2021-09-11 ENCOUNTER — Other Ambulatory Visit: Payer: Self-pay

## 2021-09-11 ENCOUNTER — Ambulatory Visit: Payer: Medicaid Other | Attending: Obstetrics

## 2021-09-11 ENCOUNTER — Ambulatory Visit: Payer: Medicaid Other | Admitting: *Deleted

## 2021-09-11 ENCOUNTER — Other Ambulatory Visit: Payer: Self-pay | Admitting: *Deleted

## 2021-09-11 ENCOUNTER — Encounter: Payer: Self-pay | Admitting: *Deleted

## 2021-09-11 VITALS — BP 90/60 | HR 60

## 2021-09-11 DIAGNOSIS — O99212 Obesity complicating pregnancy, second trimester: Secondary | ICD-10-CM | POA: Insufficient documentation

## 2021-09-11 DIAGNOSIS — E669 Obesity, unspecified: Secondary | ICD-10-CM | POA: Diagnosis not present

## 2021-09-11 DIAGNOSIS — Z3402 Encounter for supervision of normal first pregnancy, second trimester: Secondary | ICD-10-CM

## 2021-09-11 DIAGNOSIS — O4692 Antepartum hemorrhage, unspecified, second trimester: Secondary | ICD-10-CM | POA: Insufficient documentation

## 2021-09-11 DIAGNOSIS — Z362 Encounter for other antenatal screening follow-up: Secondary | ICD-10-CM | POA: Diagnosis not present

## 2021-09-11 DIAGNOSIS — O99343 Other mental disorders complicating pregnancy, third trimester: Secondary | ICD-10-CM

## 2021-09-11 DIAGNOSIS — Z3A27 27 weeks gestation of pregnancy: Secondary | ICD-10-CM | POA: Diagnosis not present

## 2021-09-11 DIAGNOSIS — O43199 Other malformation of placenta, unspecified trimester: Secondary | ICD-10-CM

## 2021-09-11 DIAGNOSIS — Z6841 Body Mass Index (BMI) 40.0 and over, adult: Secondary | ICD-10-CM

## 2021-09-11 DIAGNOSIS — O43192 Other malformation of placenta, second trimester: Secondary | ICD-10-CM | POA: Insufficient documentation

## 2021-09-11 DIAGNOSIS — Z8616 Personal history of COVID-19: Secondary | ICD-10-CM | POA: Insufficient documentation

## 2021-09-11 DIAGNOSIS — F419 Anxiety disorder, unspecified: Secondary | ICD-10-CM

## 2021-09-19 ENCOUNTER — Ambulatory Visit (INDEPENDENT_AMBULATORY_CARE_PROVIDER_SITE_OTHER): Payer: Medicaid Other | Admitting: Nurse Practitioner

## 2021-09-19 ENCOUNTER — Other Ambulatory Visit: Payer: Self-pay

## 2021-09-19 ENCOUNTER — Other Ambulatory Visit: Payer: Medicaid Other

## 2021-09-19 ENCOUNTER — Encounter: Payer: Self-pay | Admitting: Nurse Practitioner

## 2021-09-19 VITALS — BP 91/69 | HR 64 | Wt 306.5 lb

## 2021-09-19 DIAGNOSIS — F32A Depression, unspecified: Secondary | ICD-10-CM

## 2021-09-19 DIAGNOSIS — R102 Pelvic and perineal pain: Secondary | ICD-10-CM

## 2021-09-19 DIAGNOSIS — Z23 Encounter for immunization: Secondary | ICD-10-CM | POA: Diagnosis not present

## 2021-09-19 DIAGNOSIS — Z3400 Encounter for supervision of normal first pregnancy, unspecified trimester: Secondary | ICD-10-CM

## 2021-09-19 DIAGNOSIS — Z3A28 28 weeks gestation of pregnancy: Secondary | ICD-10-CM

## 2021-09-19 DIAGNOSIS — O99342 Other mental disorders complicating pregnancy, second trimester: Secondary | ICD-10-CM

## 2021-09-19 DIAGNOSIS — Z3402 Encounter for supervision of normal first pregnancy, second trimester: Secondary | ICD-10-CM

## 2021-09-19 DIAGNOSIS — R252 Cramp and spasm: Secondary | ICD-10-CM

## 2021-09-19 DIAGNOSIS — Z6841 Body Mass Index (BMI) 40.0 and over, adult: Secondary | ICD-10-CM

## 2021-09-19 DIAGNOSIS — O26893 Other specified pregnancy related conditions, third trimester: Secondary | ICD-10-CM

## 2021-09-19 MED ORDER — MAGNESIUM 200 MG PO CHEW: 1.0000 | CHEWABLE_TABLET | Freq: Every day | ORAL | 2 refills | Status: DC

## 2021-09-19 NOTE — Progress Notes (Signed)
    Subjective:  Marie Williamson is a 30 y.o. G1P0000 at [redacted]w[redacted]d being seen today for ongoing prenatal care.  She is currently monitored for the following issues for this low-risk pregnancy and has Supervision of low-risk first pregnancy; Elevated blood pressure reading without diagnosis of hypertension; Antepartum bleeding, second trimester; COVID; and Marginal insertion of umbilical cord affecting management of mother on their problem list.  Patient reports  leg cramps .  Contractions: Not present. Vag. Bleeding: None.  Movement: Present. Denies leaking of fluid.   The following portions of the patient's history were reviewed and updated as appropriate: allergies, current medications, past family history, past medical history, past social history, past surgical history and problem list. Problem list updated.  Objective:   Vitals:   09/19/21 0934  BP: 91/69  Pulse: 64  Weight: (!) 306 lb 8 oz (139 kg)    Fetal Status: Fetal Heart Rate (bpm): 159   Movement: Present     General:  Alert, oriented and cooperative. Patient is in no acute distress.  Skin: Skin is warm and dry. No rash noted.   Cardiovascular: Normal heart rate noted  Respiratory: Normal respiratory effort, no problems with respiration noted  Abdomen: Soft, gravid, appropriate for gestational age. Pain/Pressure: Present     Pelvic:  Cervical exam deferred        Extremities: Normal range of motion.  Edema: None  Mental Status: Normal mood and affect. Normal behavior. Normal judgment and thought content.   Urinalysis:      Assessment and Plan:  Pregnancy: G1P0000 at [redacted]w[redacted]d  1. Encounter for supervision of low-risk first pregnancy in second trimester Doing well Having glucola done today Reviewed selecting pediatrician and signing up for childbirth and breastfeeding classes Has [place] scheduled  - Tdap vaccine greater than or equal to 7yo IM  2. Depression during pregnancy in second trimester Crying yesterday for no  reason - wants to see [facility] provider in the office - referral sent  - Ambulatory referral to Integrated Behavioral Health  3.  Muscle cramps in legs at night Sent my chart message about magnesium preparations to try Can also try a teaspoon of mustard for muscle cramps  4. Pelvic pain affecting pregnancy in third trimester, antepartum Referral sent for PT Advised going to water exercise - showed some stretching exercises to try  - Ambulatory referral to Physical Therapy  Preterm labor symptoms and general obstetric precautions including but not limited to vaginal bleeding, contractions, leaking of fluid and fetal movement were reviewed in detail with the patient. Please refer to After Visit Summary for other counseling recommendations.  Return in about 2 weeks (around 10/03/2021) for in person ROB.  Nolene Bernheim, RN, MSN, NP-BC Nurse Practitioner, [facility] for [employer], [facility] 09/19/2021 12:41 PM

## 2021-09-19 NOTE — Patient Instructions (Addendum)
[email or web address]   AREA PEDIATRIC/FAMILY PRACTICE PHYSICIANS  Central/Southeast [place] ([address]) [facility] Deirdre Priest, MD; Lum Babe, MD; Sheffield Slider, MD; Leveda Anna, MD; McDiarmid, MD; Jerene Bears, MD; Jennette Kettle, MD; Gwendolyn Grant, MD [address] [place] [place], [place] [address] [phone] Mon-Fri 8:30-12:30, 1:30-5:00 Providers come to see babies at [facility] Accepting [facility] [place] [facility] providers who accept newborns: Docia Chuck, MD; Kateri Plummer, MD; Paulino Rily, MD [address], [place], [place] [address] [phone] Mon-Fri 8:00-5:30 Babies seen by providers at [facility] Does NOT accept Medicaid Please call early in hospitalization for appointment (limited availability)  [facility] Baldwin, MD [address] [place], [place] [address] [phone] Mon, Tue, Thur, Fri 8:30-5:00, Wed 10:00-7:00 (closed 1-2pm) Babies seen by [facility] providers Accepting Medicaid Donnie Coffin - Pediatrician Donnie Coffin, MD [address], [place], [place] [address] [phone] Mon-Fri 8:30-5:00, Sat 8:30-12:00 Provider comes to see babies at [facility] Accepting Medicaid Must have been referred from current patients or contacted office prior to delivery [facility]) Manson Passey, MD; Ave Filter, MD; Luna Fuse, MD; Kennedy Bucker, MD; Konrad Dolores, MD; Kathlene November, MD; Jenne Campus, MD; Lubertha South, MD; Wynetta Emery, MD; Duffy Rhody, MD; Gerre Couch, NP; Shirl Harris, NP [address] [place], [place], [place] [address] [phone] Mon, Halford Decamp, Thur, Fri 8:30-5:30, Wed 9:30-5:30, Sat 8:30-12:30 Babies seen by [facility] providers Accepting Medicaid Only accepting infants of first-time parents or siblings of current patients Hospital discharge coordinator will make follow-up appointment Cyril Mourning [address], [place], [place]  [address] [phone]   Fax -  [phone] [facility] [address], [place], [place]  [address] Phone - [phone]   Fax - [phone] Lucio Edward [address] [place], [place]  [address] [phone]  [place] [phone]) [facility] Jenne Pane, MD; Alita Chyle, MD; Princella Ion, MD; MD; Earlene Plater, MD; Jamesetta Orleans, MD; Alvera Novel, MD; Clarene Duke, MD; Rana Snare, MD; Carmon Ginsberg, MD; Alinda Money, MD; Hosie Poisson, MD; Mayford Knife, MD [address] [place], [place] [address] [phone] Mon-Fri 8:30-5:00 (extended evenings Mon-Thur as needed), Sat-Sun 10:00-1:00 Providers come to see babies at [facility] Accepting Medicaid for families of first-time babies and families with all children in the household age 22 and under. Must register with office prior to making appointment (M-F only). [facility] Suezanne Jacquet, NP; Lynelle Doctor, MD; Susann Givens, MD; [place], [place] [address] [place], [place] [address] [phone] Mon-Fri 8:00-5:00 Babies seen by providers at [facility] Does NOT accept Medicaid/Commercial Insurance Only [facility] [place] [facility])  Holly Bodily, MD; Zachery Dauer, MD; Stefan Church, MD; Sabino Dick, MD; Quitman Livings, MD; Farris Has, MD; Gaynell Face, MD; Betha Loa, MD; Colon Flattery, MD; Clifton James, MD [address] [place] [place], [place] [address] [phone] Mon-Fri 8:30-5:30, Sat (Oct.-Mar.) 9:00-1:00 Babies seen by providers at [facility] Accepting [facility]  [place] [phone]) [facility] Nestle, MD; Sheliah Hatch, MD [address], [place], [place] [address] [phone] Mon-Fri 8:30-5:00, Sat 8:30-12:00 Providers come to see babies at [facility] Does NOT accept [facility] at [place], [place]; Tracie Harrier, MD; [place], [place]; Wynelle Link, MD; Azucena Cecil, MD [address] [place], [place] [address] [phone] Mon-Fri 8:00-5:00 Babies seen by providers at [facility] Does NOT accept  Medicaid Only accepting babies of parents who are patients Please call early in hospitalization for appointment (limited availability) [facility] Pediatric[facility] Chestine Spore, MD; Abran Cantor, MD; Early Osmond, MD; Cherre Huger, NP; Hyacinth Meeker, MD; Dwan Bolt, MD; Jarold Motto, NP; Dario Guardian, MD; Talmage Nap, MD; Maisie Fus, MD; Pricilla Holm, MD;  [place], MD [address], [place], [place] [address] [phone] Mon-Fri 8:00-5:00, Sat 9:00-12:00 Providers come to see babies at [facility] Does NOT accept Medicaid  [place] ([address]) [facility] [employer] [facility] providers accepting new patients: Brake, NP; Wharton, PA [address], [place], [place] [address] [phone] Mon-Fri 8:00-5:00 Babies seen by providers at [facility] Does NOT accept Medicaid Only accepting babies of parents who are patients Please call early in hospitalization for appointment (limited availability) [facility] Gay, MD; Quinlan, MD [address] [place], [place] [address] [phone] (press [phone] to schedule appointment) Mon-Fri 8:00-5:00 Providers come to see babies at [facility] Does NOT accept Medicaid [facility] Mazer, MD [address] [place], [place] [address] [phone] Mon-Fri 8:30-5:00 (lunch 12:30-1:00), extended hours by appointment only Wed 5:00-6:30 Babies seen by [facility] providers Accepting Medicaid [facility] Khawaja, MD; Jordan, MD; Koberlein, MD [address], [place], [place] [address] [phone] Mon-Fri 8:00-5:00 Babies seen by [facility] providers Does NOT accept Medicaid [facility] Parker, MD; Hunter, MD; Wallace, DO [address] [place], [place] [address] [phone] Mon-Fri 8:00-5:00 Babies seen by [facility] providers Does NOT accept Medicaid [facility] Brandon, PA; Brecken, PA; Christy, NP; Dees, MD; DeClaire, MD; DeWeese, MD; Hansen, NP; Mills,  NP; Parrish, NP; Smoot, NP; Summer, MD; Vapne, MD [address] [place], [place] [address] [phone] Mon-Fri 8:30-5:00, Sat 10:00-1:00 Providers come to see babies at [facility] Does NOT accept Medicaid Free prenatal information session Tuesdays at 4:45pm [facility] Bouska, MD; Gordon, PA; Jeffery, PA; Weber, PA [address] [place] [address] [phone] Mon-Fri 7:30-5:30 Babies seen by [facility] providers [facility] [address], [place], [place]  [address] [phone]   Fax - [phone]  [place] ([address] & [address]) [facility] [place] [address] [place], [place] [address] [phone] Mon-Thur 8:00-6:00 Providers come to see babies at [facility] Accepting Medicaid [facility] Anderson, NP; Badger, MD; Beal, PA; Spencer, PA [address] [place], [place] [address] [phone] Mon-Thur 7:30-7:30, Fri 7:30-4:30 Babies seen by [facility] providers Accepting Medicaid [facility] Agbuya, MD; Klett, NP; Romgoolam, MD [address], [place], [place] [address] [phone] Mon-Fri 8:30-5:00, Sat 8:30-12:00 Providers come to see babies at [facility] Accepting Medicaid Must have "Meet & Greet" appointment at office prior to delivery [facility] ([facility]) McCord, MD; Wallace, MD; Wood, MD [address], [place], [place] [address] [phone] Mon-Wed 8:00-6:00, Thur-Fri 8:00-5:00, Sat 9:00-12:00 Providers come to see babies at [facility] Does NOT accept Medicaid Only accepting siblings of current patients [facility] [place]  [address], [place], [place]  [address] [phone]   Fax - [phone] [facility] [place] [address], [place], [place]   [address] [phone]   Fax - [phone]  [place] ([address] & [address]) [facility] Cirigliano, DO; Matthews, DO [address] , [place] [address] [phone] Mon-Fri 7:00-5:00 Babies seen by [facility] providers Does NOT accept Medicaid [facility] Briscoe, MD; Howley, PA; [place], PA [address], [place],  [address] [phone] Mon-Fri 8:00-5:00 Babies seen by [facility] providers Accepting Medicaid [facility] Boyd, MD; Church, PA; Jones, NP; Osborn, PA [address],   [place], [place] [address] [phone] Mon-Fri 8:00-5:00 Babies seen by providers at [place] [phone]) [facility] at [place], [place] [address] Madelaine Bhat [place], [place] [address] [phone] Mon-Fri 8:00-5:00 Babies seen by [facility] providers Does NOT accept Medicaid Limited availability, please call early in hospitalization to schedule follow-up [facility] Kennedy Bucker, [place]; Maisie Fus, MD; Charlesetta Garibaldi, MD; [place], [place]; Jeannine Kitten, MD; [place], [place] [address] [address], [place], [place] [address] [phone] Mon-Fri 8:30-5:00, Sat 9:00-12:00 Babies seen by providers at [facility] Accepting Medicaid Please register online then schedule online or call office [email or web address] [place] ([place] [facility] [place]) Yong Channel, NP; Dwyane Dee, MD; Leonidas Romberg, [place] [address] [place], [place], [place] [address] [phone] Mon-Fri 8:00-5:00 Babies seen by providers at [place] ([facility]) [place], MD; Rayvon Char, NP; Melina Modena, MD [address] [place], [place], [place] [address] [phone] Mon-Fri 8:00-5:30, Sat&Sun by  appointment (phones open at 8:30) Babies seen by [facility] providers Accepting Medicaid Must be a first-time baby or sibling of current patient [place]  [address], [place], [place]  [address] [phone]   Fax - [phone]  [place] [address] [phone] & [phone]) [place], [place]; [place], [place]; [place], MD; [place], [place]; Harrell Lark, MD [address], [place], [place] [address] [phone] Mon-Thur 8:00-7:00, Fri 8:00-5:00, Sat 8:00-12:00, Sun 9:00-12:00 Babies seen by [facility] providers Accepting Medicaid [facility] [employer] [employer], MD; Ruthann Cancer, MD; [facility], MD [address] [place], [place], [place] [address] [phone] Mon-Thur 8:00-5:00 Babies seen by providers at [facility] Accepting Medicaid [facility] [place] [facility] [place] Molly, MD; Coe[place], MD; Amedeo Plenty, MD; Bobby Rumpf, MD; List, MD; Lavonia Drafts, MD; Ruthann Cancer, MD; Selinda Eon, MD; Audie Box, MD; Jim Like, MD; Christie Nottingham, MD; Hubbard Hartshorn, MD; Modena Nunnery, MD [address] Barbara Cower [place], [place] [address] [phone] Mon-Fri 8:00-5:30, Sat (Oct.-Mar.) 9:00-1:00 Babies seen by providers at [facility] Accepting Medicaid Must fill out new patient packet, available online at [email or web address] [place] ([facility]) Fredderick Severance, NP; [place] Kingfisher, NP; Claiborne Billings, NP; Rolla Plate, MD; [place], [place]; Carola Rhine, MD; [place], MD; Delia Chimes, NP [address], [place], [place] [address] [phone] Mon-Thur 8:00-5:30, Fri 8:00-5:00 Babies seen by providers at [place] [phone]) [place], [place]; [place], MD; [place], MD; [place], [place] [address] [place], [place] [address] [phone] Mon-Fri 8:00-5:00 Babies seen by providers at [place] [phone]) [place] [facility], [place]; Olen Pel, MD; [place], [address], [place], [place] [address] [phone] Mon-Fri 8:00-5:00 Babies seen by providers at [facility] Does NOT accept Medicaid Limited appointment availability, please call early in hospitalization  [place] at [facility], [place]; [place], MD [address] [place], [place] [address] [phone] Mon-Fri 8:00-5:00 Babies seen by [facility] providers Does NOT accept Medicaid [place], MD; Guy Sandifer, MD; [place], [place]; [place], [address], [place], [place] [address] [phone] Mon-Fri 8:00-5:00 After hours clinic [facility] [place], [place] [address]) [phone] Mon-Fri 5:00-8:00, Sat 12:00-6:00, Sun 10:00-4:00 Babies seen by [facility] providers Accepting Medicaid [place] at [facility]  [place] [address], [place], [place]  [address] [phone]   Fax - [phone]  [place] [phone]) Adult nurse [facility], MD [address] [place], [place], [place] [address] [phone] Mon-Fri 8:00-5:00 Babies seen by [facility] providers Does NOT accept Medicaid [facility]Rh[facility] - [place] [facility] at [place]) Rene Kocher, MD [address] [place] [address] [place], [place] [address] [phone] Mon-Thur 8:00-7:00, Fri 8:00-5:00, Sat 8:00-12:00 Babies seen by providers at [facility] Accepting Medicaid - but does not have vaccinations in office (must be received elsewhere) Limited availability, please call early in hospitalization  [place] [phone]) [facility]  Wyvonne Lenz, MD [address] [place] [address] [phone]  Fax [phone]  [facility]  Lyndel Safe, MD, [place], [place], [place], [place] [address] [place], [place] [address] [phone] [facility]  [address] Sherian Maroon [place], [place] [address] [phone] [address], [place], [place] [address] [phone] [facility] Office)  [facility] [address] [place], [place] [address] [phone] [facility] [address] [place], [place], [place] [address] [phone] [facility] [address], [place], [place] [address] [phone] [facility] [address], [place], [place] [address] [phone] [facility] [address] [place], [place] [address] [phone] [facility] [address], [place], [place] [address] [phone] [facility]  [address], [place], [place] [address] [phone] Dr. Belia Heman. [place] [address] [place], [place] [address] [phone] [facility] [address], [address], [place], [place] [address] [phone] [facility] [address] [place], [place] [address] [phone]

## 2021-09-20 LAB — GLUCOSE TOLERANCE, 2 HOURS W/ 1HR
Glucose, 1 hour: 94 mg/dL (ref 65–179)
Glucose, 2 hour: 76 mg/dL (ref 65–152)
Glucose, Fasting: 72 mg/dL (ref 65–91)

## 2021-09-20 LAB — CBC
Hematocrit: 33.1 % — ABNORMAL LOW (ref 34.0–46.6)
Hemoglobin: 10.9 g/dL — ABNORMAL LOW (ref 11.1–15.9)
MCH: 28.5 pg (ref 26.6–33.0)
MCHC: 32.9 g/dL (ref 31.5–35.7)
MCV: 87 fL (ref 79–97)
Platelets: 275 10*3/uL (ref 150–450)
RBC: 3.82 x10E6/uL (ref 3.77–5.28)
RDW: 12.6 % (ref 11.7–15.4)
WBC: 11 10*3/uL — ABNORMAL HIGH (ref 3.4–10.8)

## 2021-09-20 LAB — HIV ANTIBODY (ROUTINE TESTING W REFLEX): HIV Screen 4th Generation wRfx: NONREACTIVE

## 2021-09-20 LAB — RPR: RPR Ser Ql: NONREACTIVE

## 2021-09-20 NOTE — BH Specialist Note (Signed)
Integrated Behavioral Health Initial In-Person Visit  MRN: [id number] Name: Marie Williamson  Number of Integrated Behavioral Health Clinician visits:: 1/6 Session Start time: 9:23  Session End time: 10:27 Total time:  64  minutes  Types of Service: Individual psychotherapy and Video visit  Interpretor:No. Interpretor Name and Language: n/a   Warm Hand Off Completed.        Subjective: Marie Williamson is a 30 y.o. female accompanied by  n/a Patient was referred by Nolene Bernheim, NP for positive depression screen. Patient reports the following symptoms/concerns: Feeling overwhelmed, anxious, irritable, depressed, fatigue, sleep difficulty and worry; requests coping strategies to manage emotions.  Duration of problem: Current pregnancy; Severity of problem: moderate  Objective: Mood: Anxious and Depressed and Affect: Tearful Risk of harm to self or others: No plan to harm self or others  Life Context: Family and Social: Pt lives with FOB; family supportive School/Work: Working full-time Self-Care: Recognizing a greater need for self-care Life Changes: Current first pregnancy  Patient and/or Family's Strengths/Protective Factors: Social connections, Concrete supports in place (healthy food, safe environments, etc.), and Sense of purpose  Goals Addressed: Patient will: Reduce symptoms of: anxiety, depression, and stress Increase knowledge and/or ability of: coping skills and healthy habits  Demonstrate ability to: Increase healthy adjustment to current life circumstances, Increase adequate support systems for patient/family, and Increase motivation to adhere to plan of care  Progress towards Goals: Ongoing  Interventions: Interventions utilized: Mindfulness or Management consultant, Sleep Hygiene, Psychoeducation and/or Health Education, and Link to [employer]  Standardized Assessments completed: GAD-7 and PHQ 9  Patient and/or Family Response: Pt agrees  with treatment plan  Patient Centered Plan: Patient is on the following Treatment Plan(s):  IBH  Assessment: Patient currently experiencing Adjustment disorder with mixed anxious and depressed mood.   Patient may benefit from psychoeducation and brief therapeutic interventions regarding coping with symptoms of anxiety, depression, life stress .  Plan: Follow up with behavioral health clinician on : Two weeks; Call Asher Muir as needed at [phone] Behavioral recommendations:  -Continue taking prenatal vitamin daily -CALM relaxation breathing exercise twice daily (morning; at bedtime with sleep sounds) -Begin Worry Time strategy, as discussed. Start by setting up start and end time reminders on phone today; continue daily for two weeks. -Read through additional information and resources on After Visit Summary Referral(s): Integrated [employer] (In Clinic) and Community Resources:      Rae [place], [place]  Depression screen [facility] 2/9 09/19/2021 08/24/2021 08/24/2021 07/28/2021 06/30/2021  Decreased Interest 2 0 0 0 0  Down, Depressed, Hopeless 3 0 0 0 1  PHQ - 2 Score 5 0 0 0 1  Altered sleeping 2 0 0 0 1  Tired, decreased energy 3 1 1  0 1  Change in appetite 0 0 0 0 0  Feeling bad or failure about yourself  0 0 0 0 0  Trouble concentrating 0 0 0 0 0  Moving slowly or fidgety/restless 0 0 0 0 0  Suicidal thoughts 0 0 0 - 0  PHQ-9 Score 10 1 1  0 3  Difficult doing work/chores - - - Not difficult at all -   GAD 7 : Generalized Anxiety Score 09/19/2021 08/24/2021 08/24/2021 07/28/2021  Nervous, Anxious, on Edge 0 0 0 0  Control/stop worrying 0 0 0 0  Worry too much - different things 3 0 0 0  Trouble relaxing 0 0 0 0  Restless 0 0 0 0  Easily annoyed or irritable  3 0 0 1  Afraid - awful might happen 0 0 0 0  Total GAD 7 Score 6 0 0 1

## 2021-09-21 ENCOUNTER — Ambulatory Visit (INDEPENDENT_AMBULATORY_CARE_PROVIDER_SITE_OTHER): Payer: Medicaid Other | Admitting: Clinical

## 2021-09-21 DIAGNOSIS — F4323 Adjustment disorder with mixed anxiety and depressed mood: Secondary | ICD-10-CM | POA: Diagnosis not present

## 2021-09-21 NOTE — Patient Instructions (Addendum)
Center for [facility] at [facility] [address] [place], [place] [address] [phone] (main office) [phone] [facility] office)  [email or web address] (video of [facility]; register for childbirth classes, etc.) [email or web address]  Coping with Panic Attacks   What is a panic attack?  You may have had a panic attack if you experienced four or more of the symptoms listed below coming on abruptly and peaking in about 10 minutes.  Panic Symptoms    Pounding heart   Sweating   Trembling or shaking   Shortness of breath   Feeling of choking   Chest pain   Nausea or abdominal distress     Feeling dizzy, unsteady, lightheaded, or faint   Feelings of unreality or being detached from yourself   Fear of losing control or going crazy   Fear of dying   Numbness or tingling   Chills or hot flashes      Panic attacks are sometimes accompanied by avoidance of certain places or situations. These are often situations that would be difficult to escape from or in which help might not be available. Examples might include crowded shopping malls, public transportation, restaurants, or driving.   Why do panic attacks occur?   Panic attacks are the body's alarm system gone awry. All of [place] have a built-in alarm system, powered by adrenaline, which increases our heart rate, breathing, and blood flow in response to danger. Ordinarily, this 'danger response system' works well. In some people, however, the response is either out of proportion to whatever stress is going on, or may come out of the blue without any stress at all.   For example, if you are walking in the woods and see a bear coming your way, a variety of changes occur in your body to prepare you to either fight the danger or flee from the situation. Your heart rate will increase to get more blood flow around your body, your breathing rate will quicken so that more oxygen is available, and your muscles will  tighten in order to be ready to fight or run. You may feel nauseated as blood flow leaves your stomach area and moves into your limbs. These bodily changes are all essential to helping you survive the dangerous situation. After the danger has passed, your body functions will begin to go back to normal. This is because your body also has a system for "recovering" by bringing your body back down to a normal state when the danger is over.   As you can see, the emergency response system is adaptive when there is, in fact, a "true" or "real" danger (e.g., bear). However, sometimes people find that their emergency response system is triggered in "everyday" situations where there really is no true physical danger (e.g., in a meeting, in the grocery store, while driving in normal traffic, etc.).   What triggers a panic attack?  Sometimes particularly stressful situations can trigger a panic attack. For example, an argument with your spouse or stressors at work can cause a stress response (activating the emergency response system) because you perceive it as threatening or overwhelming, even if there is no direct risk to your survival.  Sometimes panic attacks don't seem to be triggered by anything in particular- they may "come out of the blue". Somehow, the natural "fight or flight" emergency response system has gotten activated when there is no real danger. Why does the body go into "emergency mode" when there is no real danger?   Often, people  with panic attacks are frightened or alarmed by the physical sensations of the emergency response system. First, unexpected physical sensations are experienced (tightness in your chest or some shortness of breath). This then leads to feeling fearful or alarmed by these symptoms ("Something's wrong!", "Am I having a heart attack?", "Am I going to faint?") The mind perceives that there is a danger even though no real danger exists. This, in turn, activates the emergency  response system ("fight or flight"), leading to a "full blown" panic attack. In summary, panic attacks occur when we misinterpret physical symptoms as signs of impending death, craziness, loss of control, embarrassment, or fear of fear. Sometimes you may be aware of thoughts of danger that activate the emergency response system (for example, thinking "I'm having a heart attack" when you feel chest pressure or increased heart rate). At other times, however, you may not be aware of such thoughts. After several incidences of being afraid of physical sensations, anxiety and panic can occur in response to the initial sensations without conscious thoughts of danger. Instead, you just feel afraid or alarmed. In other words, the panic or fear may seem to occur "automatically" without you consciously telling yourself anything.   After having had one or more panic attacks, you may also become more focused on what is going on inside your body. You may scan your body and be more vigilant about noticing any symptoms that might signal the start of a panic attack. This makes it easier for panic attacks to happen again because you pick up on sensations you might otherwise not have noticed, and misinterpret them as something dangerous. A panic attack may then result.      How do I cope with panic attacks?  An important part of overcoming panic attacks involves re-interpreting your body's physical reactions and teaching yourself ways to decrease the physical arousal. This can be done through practicing the cognitive and behavioral interventions below.   Research has found that over half of people who have panic attacks show some signs of hyperventilation or overbreathing. This can produce initial sensations that alarm you and lead to a panic attack. Overbreathing can also develop as part of the panic attack and make the symptoms worse. When people hyperventilate, certain blood vessels in the body become narrower. In  particular, the brain may get slightly less oxygen. This can lead to the symptoms of dizziness, confusion, and lightheadedness that often occur during panic attacks. Other parts of the body may also get a bit less oxygen, which may lead to numbness or tingling in the hands or feet or the sensation of cold, clammy hands. It also may lead the heart to pump harder. Although these symptoms may be frightening and feel unpleasant, it is important to remember that hyperventilating is not dangerous. However, you can help overcome the unpleasantness of overbreathing by practicing Breathing Retraining.   Practice this basic technique three times a day, every day:   Inhale. With your shoulders relaxed, inhale as slowly and deeply as you can while you count to six. If you can, use your diaphragm to fill your lungs with air.   Hold. Keep the air in your lungs as you slowly count to four.   Exhale. Slowly breath out as you count to six.   Repeat. Do the inhale-hold-exhale cycle several times. Each time you do it, exhale for longer counts.  Like any new skill, Breathing Retraining requires practice. Try practicing this skill twice a day for several minutes.  Initially, do not try this technique in specific situations or when you become frightened or have a panic attack. Begin by practicing in a quiet environment to build up your skill level so that you can later use it in time of "emergency."   2. Decreasing Avoidance  Regardless of whether you can identify why you began having panic attacks or whether they seemed to come out of the blue, the places where you began having panic attacks often can become triggers themselves. It is not uncommon for individuals to begin to avoid the places where they have had panic attacks. Over time, the individual may begin to avoid more and more places, thereby decreasing their activities and often negatively impacting their quality of life. To break the cycle of avoidance, it is  important to first identify the places or situations that are being avoided, and then to do some "relearning."  To begin this intervention, first create a list of locations or situations that you tend to avoid. Then choose an avoided location or situation that you would like to target first. Now develop an "exposure hierarchy" for this situation or location. An "exposure hierarchy" is a list of actions that make you feel anxious in this situation. Order these actions from least to most anxiety-producing. It is often helpful to have the first item on your hierarchy involve thinking or imagining part of the feared/avoided situation.   Here is an example of an exposure hierarchy for decreasing avoidance of the grocery store. Note how it is ordered from the least amount of anxiety (at the top) to the most anxiety (at the bottom):   Think about going to the grocery store alone.   Go to the grocery store with a friend or family member.   Go to the grocery store alone to pick up a few small items (5-10 minutes in the store).   Shopping for 10-20 minutes in the store alone.   Doing the shopping for the week by myself (20-30 minutes in the store).   Your homework is to "expose" yourself to the lowest item on your hierarchy and use your breathing relaxation and coping statements (see below) to help you remain in the situation. Practice this several times during the upcoming week. Once you have mastered each item with minimal anxiety, move on to the next higher action on your list.   Cognitive Interventions  Identify your negative self-talk Anxious thoughts can increase anxiety symptoms and panic. The first step in changing anxious thinking is to identify your own negative, alarming self-talk. Some common alarming thoughts:  I'm having a heart attack.            I must be going crazy. I think I'm dying. People will think I'm crazy. I'm going to pass our.  Oh no- here it comes.  I can't stand this.  I've  got to get out of here!  2. Use positive coping statements Changing or disrupting a pattern of anxious thoughts by replacing them with more calming or supportive statements can help to divert a panic attack. Some common helpful coping statements:  This is not an emergency.  I don't like feeling this way, but I can accept it.  I can feel like this and still be okay.  This has happened before, and I was okay. I'll be okay this time, too.  I can be anxious and still deal with this situation.   /[employer] and Websites Here are a few free apps meant  to help you to help yourself.  To find, try searching on the internet to see if the app is offered on Apple/Android devices. If your first choice doesn't come up on your device, the good news is that there are many choices! Play around with different apps to see which ones are helpful to you.    Calm This is an app meant to help increase calm feelings. Includes info, strategies, and tools for tracking your feelings.      Calm Harm  This app is meant to help with self-harm. Provides many 5-minute or 15-min coping strategies for doing instead of hurting yourself.       Healthy Minds Health Minds is a problem-solving tool to help deal with emotions and cope with stress you encounter wherever you are.      MindShift This app can help people cope with anxiety. Rather than trying to avoid anxiety, you can make an important shift and face it.      MY3  MY3 features a support system, safety plan and resources with the goal of offering a tool to use in a time of need.       My Life My Voice  This mood journal offers a simple solution for tracking your thoughts, feelings and moods. Animated emoticons can help identify your mood.       Relax Melodies Designed to help with sleep, on this app you can mix sounds and meditations for relaxation.      Smiling Mind Smiling Mind is meditation made easy: it's a simple tool that  helps put a smile on your mind.        Stop, Breathe & Think  A friendly, simple guide for people through meditations for mindfulness and compassion.  Stop, Breathe and Think Kids Enter your current feelings and choose a "mission" to help you cope. Offers videos for certain moods instead of just sound recordings.       Team Orange The goal of this tool is to help teens change how they think, act, and react. This app helps you focus on your own good feelings and experiences.      The [employer] The [employer] (VHB) contains simple tools to help patients with coping, relaxation, distraction, and positive thinking.

## 2021-09-22 NOTE — BH Specialist Note (Signed)
Integrated Behavioral Health via Telemedicine Visit  09/22/2021 AURORAH SCHLACHTER [id number]  Number of Integrated Behavioral Health visits: 2 Session Start time: 1:14  Session End time: 1:46 Total time:  32  Referring Provider: Nolene Bernheim, NP Patient/Family location: Home [place] Provider location: Center for [facility] at [facility]  All persons participating in visit: [name] and [facility] Aveah Castell   Types of Service: Individual psychotherapy and Video visit  I connected with [name] and/or Erling Cruz Hinostroza's  n/a  via  Telephone or Video Enabled Telemedicine Application  (Video is Caregility application) and verified that I am speaking with the correct person using two identifiers. Discussed confidentiality: Yes   I discussed the limitations of telemedicine and the availability of in person appointments.  Discussed there is a possibility of technology failure and discussed alternative modes of communication if that failure occurs.  I discussed that engaging in this telemedicine visit, they consent to the provision of behavioral healthcare and the services will be billed under their insurance.  Patient and/or legal guardian expressed understanding and consented to Telemedicine visit: Yes   Presenting Concerns: Patient and/or family reports the following symptoms/concerns: Anxious about childbirth; using self-coping strategies and time with friends/family to cope with anxious/depressed feelings.  Duration of problem: Current pregnancy; Severity of problem: mild  Patient and/or Family's Strengths/Protective Factors: Social connections, Concrete supports in place (healthy food, safe environments, etc.), and Sense of purpose  Goals Addressed: Patient will:  Maintain reduction of symptoms of: anxiety, depression, and stress   Demonstrate ability to: Increase healthy adjustment to current life circumstances  Progress towards  Goals: Ongoing  Interventions: Interventions utilized:  Supportive Reflection Standardized Assessments completed: GAD-7 and PHQ 9  Patient and/or Family Response: Pt using self-coping strategies effectively; agrees with treatment plan  Assessment: Patient currently experiencing Adjustment disorder with mixed anxious and depressed mood.   Patient may benefit from continued psychoeducation and brief therapeutic interventions regarding coping with symptoms of anxiety and depression  Plan: Follow up with behavioral health clinician on : One month Behavioral recommendations:  -Continue taking prenatal vitamin daily -Continue using self-coping strategies to manage emotions (relaxation breathing, worry time, time with supportive friends and family) -Continue with plan to attend virtual childbirth education class of choice -Read through Postpartum Planner (on After Visit Summary) Referral(s): Integrated [employer] (In Clinic)  I discussed the assessment and treatment plan with the patient and/or parent/guardian. They were provided an opportunity to ask questions and all were answered. They agreed with the plan and demonstrated an understanding of the instructions.   They were advised to call back or seek an in-person evaluation if the symptoms worsen or if the condition fails to improve as anticipated.  Rae Lips, LCSW  Depression screen [facility] 2/9 10/05/2021 10/03/2021 09/21/2021 09/19/2021 08/24/2021  Decreased Interest 0 0 1 2 0  Down, Depressed, Hopeless 0 0 3 3 0  PHQ - 2 Score 0 0 4 5 0  Altered sleeping 1 1 0 2 0  Tired, decreased energy 1 0 3 3 1   Change in appetite 0 2 0 0 0  Feeling bad or failure about yourself  0 - 0 0 0  Trouble concentrating 0 0 0 0 0  Moving slowly or fidgety/restless 0 0 0 0 0  Suicidal thoughts 0 0 0 0 0  PHQ-9 Score 2 3 7 10 1   Difficult doing work/chores - - - - -  GAD 7 : Generalized Anxiety Score 10/05/2021 10/03/2021 09/21/2021  09/19/2021  Nervous, Anxious, on Edge 1 0 3 0  Control/stop worrying 0 0 1 0  Worry too much - different things 1 0 2 3  Trouble relaxing 0 0 0 0  Restless 0 0 0 0  Easily annoyed or irritable 1 0 3 3  Afraid - awful might happen 0 0 0 0  Total GAD 7 Score 3 0 9 6

## 2021-10-03 ENCOUNTER — Other Ambulatory Visit: Payer: Self-pay

## 2021-10-03 ENCOUNTER — Ambulatory Visit (INDEPENDENT_AMBULATORY_CARE_PROVIDER_SITE_OTHER): Payer: Medicaid Other | Admitting: Student

## 2021-10-03 VITALS — BP 113/63 | HR 78 | Wt 308.0 lb

## 2021-10-03 DIAGNOSIS — Z3403 Encounter for supervision of normal first pregnancy, third trimester: Secondary | ICD-10-CM

## 2021-10-03 DIAGNOSIS — Z3A30 30 weeks gestation of pregnancy: Secondary | ICD-10-CM

## 2021-10-03 DIAGNOSIS — Z23 Encounter for immunization: Secondary | ICD-10-CM

## 2021-10-03 NOTE — Addendum Note (Signed)
Addended by: Faythe Casa on: 10/03/2021 02:05 PM   Modules accepted: Orders

## 2021-10-03 NOTE — Progress Notes (Signed)
   PRENATAL VISIT NOTE  Subjective:  Marie Williamson is a 30 y.o. G1P0000 at [redacted]w[redacted]d being seen today for ongoing prenatal care.  She is currently monitored for the following issues for this low-risk pregnancy and has Supervision of low-risk first pregnancy; Elevated blood pressure reading without diagnosis of hypertension; Antepartum bleeding, second trimester; COVID; and Marginal insertion of umbilical cord affecting management of mother on their problem list.  Patient reports no complaints. She has many questions about deliveyr plan, when to come to hospital, what definition of preterm is, what happens when you are admitted through the MAU vs. IOL.  Contractions: Irritability. Vag. Bleeding: None.  Movement: Present. Denies leaking of fluid.   The following portions of the patient's history were reviewed and updated as appropriate: allergies, current medications, past family history, past medical history, past social history, past surgical history and problem list.   Objective:   Vitals:   10/03/21 1130  BP: 113/63  Pulse: 78  Weight: (!) 308 lb (139.7 kg)    Fetal Status: Fetal Heart Rate (bpm): 152 Fundal Height: 35 cm Movement: Present     General:  Alert, oriented and cooperative. Patient is in no acute distress.  Skin: Skin is warm and dry. No rash noted.   Cardiovascular: Normal heart rate noted  Respiratory: Normal respiratory effort, no problems with respiration noted  Abdomen: Soft, gravid, appropriate for gestational age.  Pain/Pressure: Present     Pelvic: Cervical exam deferred        Extremities: Normal range of motion.  Edema: None  Mental Status: Normal mood and affect. Normal behavior. Normal judgment and thought content.   Assessment and Plan:  Pregnancy: G1P0000 at [redacted]w[redacted]d  1. [redacted] weeks gestation of pregnancy    -discussed marginal cord insertion, patient had questions about meaning of this and baby's growth; discussed [place] results and importance of keeping [place]  scans -long discussion about protocols in MAU, IOL vs. Spontaneous onset of labor, including pain medicine and other aspects of delivery (when to admit in active labor, etc) -long discussion about IUD vs. Phexxi; patient and FOB and I all reviewed instructions for phexxi and discussed failure rates. Patient is still thinking about it.   Preterm labor symptoms and general obstetric precautions including but not limited to vaginal bleeding, contractions, leaking of fluid and fetal movement were reviewed in detail with the patient. Please refer to After Visit Summary for other counseling recommendations.   Return in about 4 weeks (around 10/31/2021), or LROB with KK.  Future Appointments  Date Time Provider Department Center  10/05/2021  1:15 PM [facility] HEALTH CLINICIAN [place] [facility]  10/16/2021 10:45 AM [facility] NURSE [facility]  10/16/2021 11:00 AM [facility] US1 [facility]  10/30/2021  9:30 AM [facility] NURSE [facility]  10/30/2021  9:45 AM [facility] US5 [facility]  11/06/2021  9:35 AM Netty Sullivant, Charlesetta Garibaldi, CNM [facility]    Marylene Land, CNM

## 2021-10-05 ENCOUNTER — Ambulatory Visit: Payer: Medicaid Other | Admitting: Clinical

## 2021-10-05 DIAGNOSIS — F4323 Adjustment disorder with mixed anxiety and depressed mood: Secondary | ICD-10-CM

## 2021-10-05 NOTE — Patient Instructions (Signed)
[facility] [address] [place], [place] [address] [phone] (main office) [phone] (Araeya Lamb's office)       BRAINSTORMING  Develop a Plan Goals: Provide a way to start conversation about your new life with a baby Assist parents in recognizing and using resources within their reach Help pave the way before birth for an easier period of transition afterwards.  Make a list of the following information to keep in a central location: Full name of Mom and Partner: _____________________________________________ Baby's full name and Date of Birth: ___________________________________________ Home Address: ___________________________________________________________ ________________________________________________________________________ Home Phone: ____________________________________________________________ Parents' cell numbers: _____________________________________________________ ________________________________________________________________________ Name and contact info for OB: ______________________________________________ Name and contact info for Pediatrician:________________________________________ Contact info for Lactation Consultants: ________________________________________  REST and SLEEP *You each need at least 4-5 hours of uninterrupted sleep every day. Write specific names and contact information.* How are you going to rest in the postpartum period? While partner's home? When partner returns to work? When you both return to work? Where will your baby sleep? Who is available to help during the day? Evening? Night? Who could move in for a period to help support you? What are some ideas to help you get enough  sleep? __________________________________________________________________________________________________________________________________________________________________________________________________________________________________________ NUTRITIOUS FOOD AND DRINK *Plan for meals before your baby is born so you can have healthy food to eat during the immediate postpartum period.* Who will look after breakfast? Lunch? Dinner? List names and contact information. Brainstorm quick, healthy ideas for each meal. What can you do before baby is born to prepare meals for the postpartum period? How can others help you with meals? Which grocery stores provide online shopping and delivery? Which restaurants offer take-out or delivery options? ______________________________________________________________________________________________________________________________________________________________________________________________________________________________________________________________________________________________________________________________________________________________________________________________________  CARE FOR MOM *It's important that mom is cared for and pampered in the postpartum period. Remember, the most important ways new mothers need care are: sleep, nutrition, gentle exercise, and time off.* Who can come take care of mom during this period? Make a list of people with their contact information. List some activities that make you feel cared for, rested, and energized? Who can make sure you have opportunities to do these things? Does mom have a space of her very own within your home that's just for her? Make a "Mama Cave" where she can be comfortable, rest, and renew herself  daily. ______________________________________________________________________________________________________________________________________________________________________________________________________________________________________________________________________________________________________________________________________________________________________________________________________    CARE FOR AND FEEDING BABY *Knowledgeable and encouraging people will offer the best support with regard to feeding your baby.* Educate yourself and choose the best feeding option for your baby. Make a list of people who will guide, support, and be a resource for you as your care for and feed your baby. (Friends that have breastfed or are currently breastfeeding, lactation consultants, breastfeeding support groups, etc.) Consider a postpartum doula. (These websites can give you information: dona.org & padanc.org) Seek out local breastfeeding resources like the breastfeeding support group at [facility] or [employer]. ______________________________________________________________________________________________________________________________________________________________________________________________________________________________________________________________________________________________________________________________________________________________________________________________________  CHORES AND ERRANDS Who can help with a thorough cleaning before baby is born? Make a list of people who will help with housekeeping and chores, like laundry, light cleaning, dishes, bathrooms, etc. Who can run some errands for you? What can you do to make sure you are stocked with basic supplies before baby is born? Who is going to do the  shopping? ______________________________________________________________________________________________________________________________________________________________________________________________________________________________________________________________________________________________________________________________________________________________________________________________________     Family Adjustment *Nurture yourselves.it helps parents be more loving and allows for better bonding with their child.* What sorts of things do you and   doing together? Which activities help you to connect and strengthen your relationship? Make a list of those things. Make a list of people whom you trust to care for your baby so you can have some time together as a couple. What types of things help partner feel connected to Mom? Make a list. What needs will partner have in order to bond with baby? Other children? Who will care for them when you go into labor and while you are in the hospital? Think about what the needs of your older children might be. Who can help you meet those needs? In what ways are you helping them prepare for bringing baby home? List some specific strategies you have for family adjustment. _______________________________________________________________________________________________________________________________________________________________________________________________________________________________________________________________________________________________________________________________________________  SUPPORT *Someone who can empathize with experiences normalizes your problems and makes them more bearable.* Make a list of other friends, neighbors, and/or co-workers you know with infants (and small children, if applicable) with whom you can connect. Make a list of local or online support groups, mom groups, etc. in which you can be  involved. ______________________________________________________________________________________________________________________________________________________________________________________________________________________________________________________________________________________________________________________________________________________________________________________________________  Childcare Plans Investigate and plan for childcare if mom is returning to work. Talk about mom's concerns about her transition back to work. Talk about partner's concerns regarding this transition.  Mental Health *Your mental health is one of the highest priorities for a pregnant or postpartum mom.* 1 in 5 women experience anxiety and/or depression from the time of conception through the first year after birth. Postpartum Mood Disorders are the #1 complication of pregnancy and childbirth and the suffering experienced by these mothers is not necessary! These illnesses are temporary and respond well to treatment, which often includes self-care, social support, talk therapy, and medication when needed. Women experiencing anxiety and depression often say things like: "I'm supposed to be happy.why do I feel so sad?", "Why can't I snap out of it?", "I'm having thoughts that scare me." There is no need to be embarrassed if you are feeling these symptoms: Overwhelmed, anxious, angry, sad, guilty, irritable, hopeless, exhausted but can't sleep You are NOT alone. You are NOT to blame. With help, you WILL be well. Where can I find help? Medical professionals such as your OB, midwife, gynecologist, family practitioner, primary care provider, pediatrician, or mental health providers; [facility] support groups: Feelings After Birth, Breastfeeding Support Group, Baby and Me Group, and Fit 4 Two exercise classes. You have permission to ask for help. It will confirm your feelings, validate your experiences,  share/learn coping strategies, and gain support and encouragement as you heal. You are important! BRAINSTORM Make a list of local resources, including resources for mom and for partner. Identify support groups. Identify people to call late at night - include names and contact info. Talk with partner about perinatal mood and anxiety disorders. Talk with your OB, midwife, and doula about baby blues and about perinatal mood and anxiety disorders. Talk with your pediatrician about perinatal mood and anxiety disorders.   Support & Sanity Savers   What do you really need?  Basics In preparing for a new baby, many expectant parents spend hours shopping for baby clothes, decorating the nursery, and deciding which car seat to buy. Yet most don't think much about what the reality of parenting a newborn will be like, and what they need to make it through that. So, here is the advice of experienced parents. We know you'll read this, and think "they're exaggerating, I don't really need that." Just trust us on these, OK? Plan for all of   this, and if it turns out you don't need it, come back and teach us how you did it!  Must-Haves (Once baby's survival needs are met, make sure you attend to your own survival needs!) Sleep An average newborn sleeps 16-18 hours per day, over 6-7 sleep periods, rarely more than three hours at a time. It is normal and healthy for a newborn to wake throughout the night... but really hard on parents!! Naps. Prioritize sleep above any responsibilities like: cleaning house, visiting friends, running errands, etc.  Sleep whenever baby sleeps. If you can't nap, at least have restful times when baby eats. The more rest you get, the more patient you will be, the more emotionally stable, and better at solving problems.  Food You may not have realized it would be difficult to eat when you have a newborn. Yet, when we talk to countless new parents, they say things like "it may be 2:00 pm  when I realize I haven't had breakfast yet." Or "every time we sit down to dinner, baby needs to eat, and my food gets cold, so I don't bother to eat it." Finger food. Before your baby is born, stock up with one months' worth of food that: 1) you can eat with one hand while holding a baby, 2) doesn't need to be prepped, 3) is good hot or cold, 4) doesn't spoil when left out for a few hours, and 5) you like to eat. Think about: nuts, dried fruit, Clif bars, pretzels, jerky, gogurt, baby carrots, apples, bananas, crackers, cheez-n-crackers, string cheese, hot pockets or frozen burritos to microwave, garden burgers and breakfast pastries to put in the toaster, yogurt drinks, etc. Restaurant Menus. Make lists of your favorite restaurants & menu items. When family/friends want to help, you can give specific information without much thought. They can either bring you the food or send gift cards for just the right meals. Freezer Meals.  Take some time to make a few meals to put in the freezer ahead of time.  Easy to freeze meals can be anything such as soup, lasagna, chicken pie, or spaghetti sauce. Set up a Meal Schedule.  Ask friends and family to sign up to bring you meals during the first few weeks of being home. (It can be passed around at baby showers!) You have no idea how helpful this will be until you are in the throes of parenting.  www.takethemameal.com is a great website to check out. Emotional Support Know who to call when you're stressed out. Parenting a newborn is very challenging work. There are times when it totally overwhelms your normal coping abilities. EVERY NEW PARENT NEEDS TO HAVE A PLAN FOR WHO TO CALL WHEN THEY JUST CAN'T COPE ANY MORE. (And it has to be someone other than the baby's other parent!) Before your baby is born, come up with at least one person you can call for support - write their phone number down and post it on the refrigerator. Anxiety & Sadness. Baby blues are normal after  pregnancy; however, there are more severe types of anxiety & sadness which can occur and should not be ignored.  They are always treatable, but you have to take the first step by reaching out for help. [facility] offers a "Mom Talk" group which meets every Tuesday from 10 am - 11 am.  This group is for new moms who need support and connection after their babies are born.  Call [phone].  Really, Really Helpful (Plan for them!   Make sure these happen often!!) Physical Support with Taking Care of Yourselves Asking friends and family. Before your baby is born, set up a schedule of people who can come and visit and help out (or ask a friend to schedule for you). Any time someone says "let me know what I can do to help," sign them up for a day. When they get there, their job is not to take care of the baby (that's your job and your joy). Their job is to take care of you!  Postpartum doulas. If you don't have anyone you can call on for support, look into postpartum doulas:  professionals at helping parents with caring for baby, caring for themselves, getting breastfeeding started, and helping with household tasks. www.padanc.org is a helpful website for learning about doulas in our area. Peer Support / Parent Groups Why: One of the greatest ideas for new parents is to be around other new parents. Parent groups give you a chance to share and listen to others who are going through the same season of life, get a sense of what is normal infant development by watching several babies learn and grow, share your stories of triumph and struggles with empathetic ears, and forgive your own mistakes when you realize all parents are learning by trial and error. Where to find: There are many places you can meet other new parents throughout our community.  [facility] offers the following classes for new moms and their little ones:  Baby and Me (Birth to Crawling) and Breastfeeding Support Group. Go to  www.conehealthybaby.com or call [phone] for more information. Time for your Relationship It's easy to get so caught up in meeting baby's immediate needs that it's hard to find time to connect with your partner, and meet the needs of your relationship. It's also easy to forget what "quality time with your partner" actually looks like. If you take your baby on a date, you'd be amazed how much of your couple time is spent feeding the baby, diapering the baby, admiring the baby, and talking about the baby. Dating: Try to take time for just the two of you. Babysitter tip: Sometimes when moms are breastfeeding a newborn, they find it hard to figure out how to schedule outings around baby's unpredictable feeding schedules. Have the babysitter come for a three hour period. When she comes over, if baby has just eaten, you can leave right away, and come back in two hours. If baby hasn't fed recently, you start the date at home. Once baby gets hungry and gets a good feeding in, you can head out for the rest of your date time. Date Nights at Home: If you can't get out, at least set aside one evening a week to prioritize your relationship: whenever baby dozes off or doesn't have any immediate needs, spend a little time focusing on each other. Potential conflicts: The main relationship conflicts that come up for new parents are: issues related to sexuality, financial stresses, a feeling of an unfair division of household tasks, and conflicts in parenting styles. The more you can work on these issues before baby arrives, the better!  Fun and Frills (Don't forget these. and don't feel guilty for indulging in them!) Everyone has something in life that is a fun little treat that they do just for themselves. It may be: reading the morning paper, or going for a daily jog, or having coffee with a friend once a week, or going to a movie on Friday nights,   Friday nights, or fine chocolates, or bubble baths, or curling up with a good  book. Unless you do fun things for yourself every now and then, it's hard to have the energy for fun with your baby. Whatever your "special" treats are, make sure you find a way to continue to indulge in them after your baby is born. These special moments can recharge you, and allow you to return to baby with a new joy   PERINATAL MOOD DISORDERS: MATERNAL MENTAL HEALTH FROM CONCEPTION THROUGH THE POSTPARTUM PERIOD   Emergency and Crisis Resources:  If you are an imminent risk to self or others, are experiencing intense personal distress, and/or have noticed significant changes in activities of daily living, call:  [facility]: [phone] Mobile Crisis: 877-626-1772 National Suicide Hotline: 1-800-273-8255 Or visit the following crisis centers: Local Emergency Departments [facility]: [address], [place] [phone]. Hours: 8:30AM-5PM. Insurance Accepted: Medicaid, Medicare, and Uninsured.  [facility]  [address], [place] Mon-Friday 8am-3pm  [phone]                                                                                    Non-Crisis Resources: To identify specific providers that are covered by your insurance, contact your insurance company or local agencies: [facility]: 1-800-256-2452 [facility]: 888-581-9988 [employer] [place]: 1-800-939-5911 Postpartum Support International- [place] 1-800-944-4773                                                      Outpatient therapy and medication management providers:  [facility] [phone] Hours: 9AM-5PM  Insurance Accepted: AARP, Aetna, BCBS, Cigna, [employer], Humana, Medicare  [facility] [phone] Hours: 8AM-5PM  nsurance Accepted: All insurances EXCEPT AARP, Aetna, [employer], and Humana [facility]: [phone]             Hours: 8AM-8PM Insurance  Accepted: Aetna, BCBS, Cigna, [employer], Medicaid, Medicare, Uninsured [facility]: [phone] [facility]: [phone] Hours: 8:30AM-7PM Insurance Accepted: Aetna, BCBS, Medicaid, Medicare, Tricare, United [employer] [facility]:  [phone]              Hours:9AM-5PM Insurance Accepted:  Aetna, BCBS, [facility], Medicaid, United Health Care  [facility] [phone] Hours: 9AM-5:30PM Insurance Accepted: AARP, Aetna, BCBS, Cigna, and Medicaid, Medicare, United Health Care [facility]:  [phone] Hours: 9am-5pm Insurance Accepted: BCBS; they do not accept Medicaid/Medicare [facility]: [phone] Hours: 9am-9pm Insurance Accepted: All major insurance including Medicaid and Medicare [facility]: [phone] Hours: 9AM-5:30PM Insurance Accepted: All insurances EXCEPT Medicaid and Medicare. [facility]: [phone]                                                                         Parenting Support Groups [facility]: [phone] [facility]:  [phone] Family Support Network (support for children in the NICU and/or with special needs), [phone]                                                                   Mental Health Support Groups Mental Health Association: [phone]                                                                                     Online Resources: Postpartum Support International: http://www.postpartum.net/  800-944-4PPD 2Moms Supporting Moms:  www.momssupportingmoms.net    

## 2021-10-16 ENCOUNTER — Encounter: Payer: Self-pay | Admitting: *Deleted

## 2021-10-16 ENCOUNTER — Other Ambulatory Visit: Payer: Self-pay | Admitting: Obstetrics and Gynecology

## 2021-10-16 ENCOUNTER — Other Ambulatory Visit: Payer: Self-pay | Admitting: *Deleted

## 2021-10-16 ENCOUNTER — Other Ambulatory Visit: Payer: Self-pay

## 2021-10-16 ENCOUNTER — Ambulatory Visit: Payer: Medicaid Other | Attending: Obstetrics and Gynecology

## 2021-10-16 ENCOUNTER — Ambulatory Visit: Payer: Medicaid Other | Admitting: *Deleted

## 2021-10-16 VITALS — BP 120/69 | HR 64

## 2021-10-16 DIAGNOSIS — Z3403 Encounter for supervision of normal first pregnancy, third trimester: Secondary | ICD-10-CM

## 2021-10-16 DIAGNOSIS — E669 Obesity, unspecified: Secondary | ICD-10-CM | POA: Diagnosis not present

## 2021-10-16 DIAGNOSIS — Z6841 Body Mass Index (BMI) 40.0 and over, adult: Secondary | ICD-10-CM

## 2021-10-16 DIAGNOSIS — O43199 Other malformation of placenta, unspecified trimester: Secondary | ICD-10-CM

## 2021-10-16 DIAGNOSIS — Z3A32 32 weeks gestation of pregnancy: Secondary | ICD-10-CM | POA: Diagnosis present

## 2021-10-16 DIAGNOSIS — O99213 Obesity complicating pregnancy, third trimester: Secondary | ICD-10-CM | POA: Diagnosis not present

## 2021-10-16 DIAGNOSIS — O99343 Other mental disorders complicating pregnancy, third trimester: Secondary | ICD-10-CM

## 2021-10-16 DIAGNOSIS — O43193 Other malformation of placenta, third trimester: Secondary | ICD-10-CM | POA: Insufficient documentation

## 2021-10-16 DIAGNOSIS — O99340 Other mental disorders complicating pregnancy, unspecified trimester: Secondary | ICD-10-CM | POA: Diagnosis present

## 2021-10-16 DIAGNOSIS — O2690 Pregnancy related conditions, unspecified, unspecified trimester: Secondary | ICD-10-CM | POA: Diagnosis present

## 2021-10-16 DIAGNOSIS — O4692 Antepartum hemorrhage, unspecified, second trimester: Secondary | ICD-10-CM

## 2021-10-19 NOTE — BH Specialist Note (Signed)
Integrated Behavioral Health via Telemedicine Visit  10/19/2021 MIRISSA LOPRESTI [id number]  Number of Integrated Behavioral Health visits: 3 Session Start time: 1:18  Session End time: 1:45 Total time:  27  Referring Provider: Nolene Bernheim, NP Patient/Family location: Home [facility] Provider location: Center for [facility]  All persons participating in visit: [name] and [facility] Ajah Van[facility]   Types of Service: Individual psychotherapy and Video visit  I connected with [name] and/or Erling Cruz Erhart's  n/a  via  Telephone or Video Enabled Telemedicine Application  (Video is Caregility application) and verified that I am speaking with the correct person using two identifiers. Discussed confidentiality: Yes   I discussed the limitations of telemedicine and the availability of in person appointments.  Discussed there is a possibility of technology failure and discussed alternative modes of communication if that failure occurs.  I discussed that engaging in this telemedicine visit, they consent to the provision of behavioral healthcare and the services will be billed under their insurance.  Patient and/or legal guardian expressed understanding and consented to Telemedicine visit: Yes   Presenting Concerns: Patient and/or family reports the following symptoms/concerns: Anxious about upcoming childbirth, obtaining breast pump, unknowns about postpartum time. Duration of problem: Current pregnancy; Severity of problem: mild  Patient and/or Family's Strengths/Protective Factors: Social connections, Concrete supports in place (healthy food, safe environments, etc.), and Sense of purpose  Goals Addressed: Patient will:  Reduce symptoms of: anxiety   Demonstrate ability to: Increase healthy adjustment to current life circumstances  Progress towards Goals: Ongoing  Interventions: Interventions utilized:  Link to [employer] and Supportive Reflection Standardized Assessments completed: Not Needed  Patient and/or Family Response: Pt agrees with ongoing treatment plan  Assessment: Patient currently experiencing Adjustment disorder with mixed anxious and depressed mood.   Patient may benefit from continued psychoeducation and brief therapeutic interventions regarding coping with symptoms of anxiety today .  Plan: Follow up with behavioral health clinician on : one month Behavioral recommendations:  -Continue taking prenatal vitamin daily -Continue using self-coping strategies to manage emotions for remainder of pregnancy -Contact insurance, and then [facility], about obtaining breast pump -Consider [email or web address] for tips on healthy introductions between puppy and baby  Referral(s): Integrated Art gallery [employer] (In Clinic) and [employer]:  dog/baby introduction  I discussed the assessment and treatment plan with the patient and/or parent/guardian. They were provided an opportunity to ask questions and all were answered. They agreed with the plan and demonstrated an understanding of the instructions.   They were advised to call back or seek an in-person evaluation if the symptoms worsen or if the condition fails to improve as anticipated.  Rae Lips, LCSW  Depression screen [facility] 2/9 10/05/2021 10/03/2021 09/21/2021 09/19/2021 08/24/2021  Decreased Interest 0 0 1 2 0  Down, Depressed, Hopeless 0 0 3 3 0  PHQ - 2 Score 0 0 4 5 0  Altered sleeping 1 1 0 2 0  Tired, decreased energy 1 0 3 3 1   Change in appetite 0 2 0 0 0  Feeling bad or failure about yourself  0 - 0 0 0  Trouble concentrating 0 0 0 0 0  Moving slowly or fidgety/restless 0 0 0 0 0  Suicidal thoughts 0 0 0 0 0  PHQ-9 Score 2 3 7 10 1   Difficult doing work/chores - - - - -   GAD 7 : Generalized Anxiety Score 10/05/2021 10/03/2021 09/21/2021 09/19/2021  Nervous, Anxious, on Edge 1 0 3 0  Control/stop worrying 0 0 1 0   Worry too much - different things 1 0 2 3  Trouble relaxing 0 0 0 0  Restless 0 0 0 0  Easily annoyed or irritable 1 0 3 3  Afraid - awful might happen 0 0 0 0  Total GAD 7 Score 3 0 9 6

## 2021-10-30 ENCOUNTER — Other Ambulatory Visit: Payer: Self-pay

## 2021-10-30 ENCOUNTER — Ambulatory Visit: Payer: Medicaid Other | Attending: Obstetrics and Gynecology

## 2021-10-30 ENCOUNTER — Ambulatory Visit: Payer: Medicaid Other | Admitting: *Deleted

## 2021-10-30 VITALS — BP 101/69 | HR 71

## 2021-10-30 DIAGNOSIS — E669 Obesity, unspecified: Secondary | ICD-10-CM

## 2021-10-30 DIAGNOSIS — Z3A34 34 weeks gestation of pregnancy: Secondary | ICD-10-CM | POA: Insufficient documentation

## 2021-10-30 DIAGNOSIS — Z3403 Encounter for supervision of normal first pregnancy, third trimester: Secondary | ICD-10-CM

## 2021-10-30 DIAGNOSIS — O43193 Other malformation of placenta, third trimester: Secondary | ICD-10-CM | POA: Diagnosis not present

## 2021-10-30 DIAGNOSIS — O4693 Antepartum hemorrhage, unspecified, third trimester: Secondary | ICD-10-CM | POA: Diagnosis not present

## 2021-10-30 DIAGNOSIS — Z6841 Body Mass Index (BMI) 40.0 and over, adult: Secondary | ICD-10-CM

## 2021-10-30 DIAGNOSIS — O4692 Antepartum hemorrhage, unspecified, second trimester: Secondary | ICD-10-CM

## 2021-10-30 DIAGNOSIS — O43199 Other malformation of placenta, unspecified trimester: Secondary | ICD-10-CM | POA: Insufficient documentation

## 2021-10-30 DIAGNOSIS — O99213 Obesity complicating pregnancy, third trimester: Secondary | ICD-10-CM | POA: Insufficient documentation

## 2021-11-02 ENCOUNTER — Ambulatory Visit (INDEPENDENT_AMBULATORY_CARE_PROVIDER_SITE_OTHER): Payer: Medicaid Other | Admitting: Clinical

## 2021-11-02 DIAGNOSIS — F4323 Adjustment disorder with mixed anxiety and depressed mood: Secondary | ICD-10-CM

## 2021-11-02 NOTE — Patient Instructions (Signed)
Center for [facility] at [facility] for Women [address] [place], [place] [address] [phone] (main office) [phone] [facility] office)  [email or web address]

## 2021-11-06 ENCOUNTER — Ambulatory Visit (INDEPENDENT_AMBULATORY_CARE_PROVIDER_SITE_OTHER): Payer: Medicaid Other | Admitting: Student

## 2021-11-06 ENCOUNTER — Ambulatory Visit (INDEPENDENT_AMBULATORY_CARE_PROVIDER_SITE_OTHER): Payer: Medicaid Other

## 2021-11-06 ENCOUNTER — Other Ambulatory Visit: Payer: Self-pay

## 2021-11-06 ENCOUNTER — Ambulatory Visit: Payer: Medicaid Other | Admitting: *Deleted

## 2021-11-06 VITALS — BP 109/66 | HR 84 | Wt 310.0 lb

## 2021-11-06 DIAGNOSIS — Z3403 Encounter for supervision of normal first pregnancy, third trimester: Secondary | ICD-10-CM

## 2021-11-06 DIAGNOSIS — O99210 Obesity complicating pregnancy, unspecified trimester: Secondary | ICD-10-CM | POA: Diagnosis not present

## 2021-11-06 DIAGNOSIS — Z3A35 35 weeks gestation of pregnancy: Secondary | ICD-10-CM

## 2021-11-06 NOTE — Progress Notes (Signed)
   PRENATAL VISIT NOTE  Subjective:  Marie Williamson is a 30 y.o. G1P0000 at [redacted]w[redacted]d being seen today for ongoing prenatal care.  She is currently monitored for the following issues for this low-risk pregnancy and has Supervision of low-risk first pregnancy; Elevated blood pressure reading without diagnosis of hypertension; Antepartum bleeding, second trimester; COVID; and Marginal insertion of umbilical cord affecting management of mother on their problem list.  Patient reports no complaints. She is overall doing really well; happy about her baby shower over the weekend.  Contractions: Irritability. Vag. Bleeding: None.  Movement: Present. Denies leaking of fluid.   The following portions of the patient's history were reviewed and updated as appropriate: allergies, current medications, past family history, past medical history, past social history, past surgical history and problem list.   Objective:   Vitals:   11/06/21 0953  BP: 109/66  Pulse: 84  Weight: (!) 310 lb (140.6 kg)    Fetal Status: Fetal Heart Rate (bpm): 144 Fundal Height: 41 cm Movement: Present     General:  Alert, oriented and cooperative. Patient is in no acute distress.  Skin: Skin is warm and dry. No rash noted.   Cardiovascular: Normal heart rate noted  Respiratory: Normal respiratory effort, no problems with respiration noted  Abdomen: Soft, gravid, appropriate for gestational age.  Pain/Pressure: Present     Pelvic: Cervical exam deferred        Extremities: Normal range of motion.  Edema: None  Mental Status: Normal mood and affect. Normal behavior. Normal judgment and thought content.   Assessment and Plan:  Pregnancy: G1P0000 at [redacted]w[redacted]d 1. [redacted] weeks gestation of pregnancy   2. Encounter for supervision of low-risk first pregnancy in third trimester    -keep appt on 11-22 for fetal testing -discussed birth control; she does not want IUD and wants phexxi -keep appt at 11:15 for NST and for growth scan next  week   Preterm labor symptoms and general obstetric precautions including but not limited to vaginal bleeding, contractions, leaking of fluid and fetal movement were reviewed in detail with the patient. Please refer to After Visit Summary for other counseling recommendations.   Return in about 3 weeks (around 11/27/2021), or LROB with APP.  Future Appointments  Date Time Provider Department Center  11/06/2021 11:15 AM [facility] NST [facility]  11/14/2021  9:30 AM [facility] NURSE [facility]  11/14/2021  9:45 AM [facility] US5 [facility]  11/20/2021 11:15 AM [facility] NST [facility]  11/30/2021  1:15 PM [facility]-BEHAVIORAL HEALTH CLINICIAN [facility]    Marylene Land, CNM

## 2021-11-14 ENCOUNTER — Encounter: Payer: Self-pay | Admitting: *Deleted

## 2021-11-14 ENCOUNTER — Ambulatory Visit: Payer: Medicaid Other | Attending: Obstetrics and Gynecology

## 2021-11-14 ENCOUNTER — Other Ambulatory Visit: Payer: Self-pay

## 2021-11-14 ENCOUNTER — Ambulatory Visit: Payer: Medicaid Other | Admitting: *Deleted

## 2021-11-14 VITALS — BP 108/63 | HR 75

## 2021-11-14 DIAGNOSIS — Z3A36 36 weeks gestation of pregnancy: Secondary | ICD-10-CM | POA: Diagnosis not present

## 2021-11-14 DIAGNOSIS — O43199 Other malformation of placenta, unspecified trimester: Secondary | ICD-10-CM | POA: Diagnosis present

## 2021-11-14 DIAGNOSIS — O4692 Antepartum hemorrhage, unspecified, second trimester: Secondary | ICD-10-CM | POA: Diagnosis present

## 2021-11-14 DIAGNOSIS — O43193 Other malformation of placenta, third trimester: Secondary | ICD-10-CM | POA: Insufficient documentation

## 2021-11-14 DIAGNOSIS — Z3403 Encounter for supervision of normal first pregnancy, third trimester: Secondary | ICD-10-CM

## 2021-11-14 DIAGNOSIS — E669 Obesity, unspecified: Secondary | ICD-10-CM

## 2021-11-14 DIAGNOSIS — O99213 Obesity complicating pregnancy, third trimester: Secondary | ICD-10-CM | POA: Diagnosis not present

## 2021-11-14 DIAGNOSIS — Z6841 Body Mass Index (BMI) 40.0 and over, adult: Secondary | ICD-10-CM

## 2021-11-20 ENCOUNTER — Other Ambulatory Visit: Payer: Self-pay

## 2021-11-20 ENCOUNTER — Ambulatory Visit: Payer: Medicaid Other | Admitting: *Deleted

## 2021-11-20 ENCOUNTER — Ambulatory Visit (INDEPENDENT_AMBULATORY_CARE_PROVIDER_SITE_OTHER): Payer: Medicaid Other

## 2021-11-20 VITALS — BP 97/67 | HR 88

## 2021-11-20 DIAGNOSIS — O99210 Obesity complicating pregnancy, unspecified trimester: Secondary | ICD-10-CM

## 2021-11-20 NOTE — Progress Notes (Signed)

## 2021-11-22 NOTE — BH Specialist Note (Signed)
Integrated Behavioral Health via Telemedicine Visit  11/22/2021 [name] [id number]  Number of Integrated Behavioral Health visits: 4 Session Start time: 1:22  Session End time: 1:43 Total time:  21  Referring Provider: Chrissie Noa, NP Patient/Family location: Home [facility] Provider location: Center for [facility] at [facility]  All persons participating in visit: [name] and [facility] Dameka Younker   Types of Service: Individual psychotherapy and Telephone visit  I connected with [name] and/or Erling Cruz Goosby's  n/a  via  Telephone or Video Enabled Telemedicine Application  (Video is Caregility application) and verified that I am speaking with the correct person using two identifiers. Discussed confidentiality: Yes   I discussed the limitations of telemedicine and the availability of in person appointments.  Discussed there is a possibility of technology failure and discussed alternative modes of communication if that failure occurs.  I discussed that engaging in this telemedicine visit, they consent to the provision of behavioral healthcare and the services will be billed under their insurance.  Patient and/or legal guardian expressed understanding and consented to Telemedicine visit: Yes   Presenting Concerns: Patient and/or family reports the following symptoms/concerns: Fatigue, sore back, mild sleep difficulty attributed to late pregnancy; pt received breast pump today; is prepared for baby's arrival. Pt prefers no induction unless needed for medical purposes.  Duration of problem: Current pregnancy; Severity of problem: mild  Patient and/or Family's Strengths/Protective Factors: Social connections, Concrete supports in place (healthy food, safe environments, etc.), Sense of purpose, and Physical Health (exercise, healthy diet, medication compliance, etc.)  Goals Addressed: Patient will:  Reduce symptoms of: anxiety    Progress towards Goals: Ongoing  Interventions:  Interventions utilized:  Psychoeducation and/or Health Education and Supportive Reflection Standardized Assessments completed:  PHQ9/GAD7 given in past two weeks  Patient and/or Family Response: Pt agrees with treatment plan  Assessment: Patient currently experiencing Adjustment disorder with mixed anxious and depressed mood.   Patient may benefit from continued brief therapeutic intervention today.  Plan: Follow up with behavioral health clinician on : Three weeks (about 2 weeks postpartum) Behavioral recommendations:  -Continue taking prenatal vitamin and iron pills as prescribed -Balance between physical activity (short walks) and relaxation/distraction activities this week, as discussed, to help time pass and manage emotions Referral(s): Integrated [employer] (In Clinic)  I discussed the assessment and treatment plan with the patient and/or parent/guardian. They were provided an opportunity to ask questions and all were answered. They agreed with the plan and demonstrated an understanding of the instructions.   They were advised to call back or seek an in-person evaluation if the symptoms worsen or if the condition fails to improve as anticipated.  Valetta Close Raenell Mensing, LCSW

## 2021-11-26 ENCOUNTER — Inpatient Hospital Stay (HOSPITAL_COMMUNITY)
Admission: AD | Admit: 2021-11-26 | Discharge: 2021-11-26 | Disposition: A | Discharge status: Home / Self Care | Payer: Medicaid Other | Source: Ambulatory Visit | Attending: Obstetrics and Gynecology | Admitting: Obstetrics and Gynecology

## 2021-11-26 ENCOUNTER — Encounter (HOSPITAL_COMMUNITY): Payer: Self-pay | Admitting: Obstetrics and Gynecology

## 2021-11-26 ENCOUNTER — Other Ambulatory Visit: Payer: Self-pay

## 2021-11-26 DIAGNOSIS — O99013 Anemia complicating pregnancy, third trimester: Secondary | ICD-10-CM | POA: Diagnosis present

## 2021-11-26 DIAGNOSIS — Z3A38 38 weeks gestation of pregnancy: Secondary | ICD-10-CM | POA: Diagnosis not present

## 2021-11-26 DIAGNOSIS — R0602 Shortness of breath: Secondary | ICD-10-CM | POA: Insufficient documentation

## 2021-11-26 DIAGNOSIS — R42 Dizziness and giddiness: Secondary | ICD-10-CM

## 2021-11-26 DIAGNOSIS — O26893 Other specified pregnancy related conditions, third trimester: Secondary | ICD-10-CM | POA: Diagnosis not present

## 2021-11-26 DIAGNOSIS — O0933 Supervision of pregnancy with insufficient antenatal care, third trimester: Secondary | ICD-10-CM | POA: Diagnosis not present

## 2021-11-26 DIAGNOSIS — M545 Low back pain, unspecified: Secondary | ICD-10-CM | POA: Diagnosis not present

## 2021-11-26 DIAGNOSIS — D649 Anemia, unspecified: Secondary | ICD-10-CM | POA: Diagnosis not present

## 2021-11-26 HISTORY — DX: Anemia complicating pregnancy, third trimester: O99.013

## 2021-11-26 LAB — CBC
HCT: 31.3 % — ABNORMAL LOW (ref 36.0–46.0)
Hemoglobin: 10.1 g/dL — ABNORMAL LOW (ref 12.0–15.0)
MCH: 28.5 pg (ref 26.0–34.0)
MCHC: 32.3 g/dL (ref 30.0–36.0)
MCV: 88.2 fL (ref 80.0–100.0)
Platelets: 291 10*3/uL (ref 150–400)
RBC: 3.55 MIL/uL — ABNORMAL LOW (ref 3.87–5.11)
RDW: 13.4 % (ref 11.5–15.5)
WBC: 12.7 10*3/uL — ABNORMAL HIGH (ref 4.0–10.5)
nRBC: 0 % (ref 0.0–0.2)

## 2021-11-26 MED ORDER — FERROUS SULFATE 325 (65 FE) MG PO TABS: 325.0000 mg | ORAL_TABLET | ORAL | 1 refills | Status: AC

## 2021-11-26 NOTE — MAU Note (Signed)
Pt stated she was out in the store, felt constant pelvis pressure and lower back pain. She then started feeling hot and dizzy.  She went to the car and sat down. She does not feel dizzy anymore.

## 2021-11-26 NOTE — MAU Provider Note (Signed)
History     [id number]  Arrival date and time: 11/26/21 1846    Chief Complaint  Patient presents with   dizzness     HPI Marie Williamson is a 30 y.o. at [redacted]w[redacted]d by LMP with PMHx notable for elevated BMI, who presents for a single episode of dizziness. Pt stated she was out in the store, felt constant pelvis pressure and lower back pain. She then started feeling hot and dizzy. At the time she started to feel nervous and then felt anxious and then felt short of breath. She was afraid she would lose conscious but denies that she ever did.  She went to the car, sat down, drank some water and felt much better. She does not feel dizzy and she has no other symptoms. She has not had this happen at any other time during the pregnancy.   ANC: VB at 13 weeks Marginal cord insertion - last growth was 11/22 39%ile, normal AFI COVID during pregnancy 05/2021  Review of outside prenatal records from our office [facility]) (in media tab): Limited PNC, but has next appointment tomorrow.   Review of discharge summary from last admission on 05/2021: seen for COVID and vaginal bleeding  Vaginal bleeding: No LOF: No Fetal Movement: Yes Contractions: No  A/Positive/-- (05/31 1029)  OB History     Gravida  1   Para  0   Term  0   Preterm  0   AB  0   Living  0      SAB  0   IAB  0   Ectopic  0   Multiple  0   Live Births  0           Past Medical History:  Diagnosis Date   Medical history non-contributory     Past Surgical History:  Procedure Laterality Date   NO PAST SURGERIES      Family History  Problem Relation Age of Onset   Diabetes Mother    Congestive Heart Failure Mother    Cancer Mother    Bipolar disorder Mother    Depression Mother    Stroke Maternal Grandmother    Lung cancer Maternal Grandfather     Social History   Socioeconomic History   Marital status: Single    Spouse name: Not on file   Number of children: Not on file   Years of education:  Not on file   Highest education level: Not on file  Occupational History   Not on file  Tobacco Use   Smoking status: Never   Smokeless tobacco: Never  Vaping Use   Vaping Use: Never used  Substance and Sexual Activity   Alcohol use: Not Currently   Drug use: No   Sexual activity: Yes  Other Topics Concern   Not on file  Social History Narrative   Not on file   Social Determinants of Health   Financial Resource Strain: Not on file  Food Insecurity: No Food Insecurity   Worried About Running Out of Food in the Last Year: Never true   Ran Out of Food in the Last Year: Never true  Transportation Needs: No Transportation Needs   Lack of Transportation (Medical): No   Lack of Transportation (Non-Medical): No  Physical Activity: Not on file  Stress: Not on file  Social Connections: Not on file  Intimate Partner Violence: Not on file    No Known Allergies  No current facility-administered medications on file prior to encounter.  Current Outpatient Medications on File Prior to Encounter  Medication Sig Dispense Refill   Magnesium 200 MG CHEW Chew 1 tablet by mouth daily. 30 tablet 2   Prenatal Vit-Fe Fumarate-FA (PRENATAL MULTIVITAMIN) TABS tablet Take 1 tablet by mouth daily at 12 noon.       Review of Systems  Constitutional:  Positive for diaphoresis.  HENT: Negative.    Eyes: Negative.   Respiratory:  Positive for shortness of breath. Negative for cough.   Cardiovascular: Negative.   Gastrointestinal: Negative.   Genitourinary: Negative.   Musculoskeletal: Negative.   Skin:  Negative for rash.  Neurological:  Positive for dizziness.  Psychiatric/Behavioral:  The patient is nervous/anxious.   Pertinent positives and negative per HPI, all others reviewed and negative  Physical Exam   BP 118/67   Pulse 81   Temp 98.7 F (37.1 C) (Oral)   Resp 20   LMP 03/02/2021 (Exact Date)   SpO2 100%   Patient Vitals for the past 24 hrs:  BP Temp Temp src Pulse Resp  SpO2  11/26/21 1945 118/67 98.7 F (37.1 C) Oral 81 20 100 %    Physical Exam Constitutional:      Appearance: Normal appearance. She is obese.  HENT:     Head: Normocephalic and atraumatic.     Nose: Nose normal.     Mouth/Throat:     Mouth: Mucous membranes are moist.     Pharynx: Oropharynx is clear.  Eyes:     Extraocular Movements: Extraocular movements intact.     Pupils: Pupils are equal, round, and reactive to light.  Cardiovascular:     Rate and Rhythm: Normal rate and regular rhythm.  Pulmonary:     Effort: Pulmonary effort is normal.     Breath sounds: Normal breath sounds.  Abdominal:     General: Bowel sounds are normal.     Palpations: Abdomen is soft.     Comments: gravid  Skin:    General: Skin is warm.  Neurological:     General: No focal deficit present.     Mental Status: She is alert and oriented to person, place, and time.  Psychiatric:        Mood and Affect: Mood normal.        Thought Content: Thought content normal.        Judgment: Judgment normal.     FHT Baseline 160, moderate variability, positive for accels, no decels - reactive Toco: no contractions  Labs Results for orders placed or performed during the hospital encounter of 11/26/21 (from the past 24 hour(s))  CBC     Status: Abnormal   Collection Time: 11/26/21  8:01 PM  Result Value Ref Range   WBC 12.7 (H) 4.0 - 10.5 K/uL   RBC 3.55 (L) 3.87 - 5.11 MIL/uL   Hemoglobin 10.1 (L) 12.0 - 15.0 g/dL   HCT 86.7 (L) 67.2 - 09.4 %   MCV 88.2 80.0 - 100.0 fL   MCH 28.5 26.0 - 34.0 pg   MCHC 32.3 30.0 - 36.0 g/dL   RDW 70.9 62.8 - 36.6 %   Platelets 291 150 - 400 K/uL   nRBC 0.0 0.0 - 0.2 %    Imaging No results found.  MAU Course  Procedures Lab Orders         CBC    No orders of the defined types were placed in this encounter.  Imaging Orders  No imaging studies ordered today    MDM - moderate  Orthostatic vitals normal CBC EKG  Assessment and Plan  #Vagal response   - CBC shows HgB 10.1 c/w anemia of pregnancy. Will start Iron 325mg  every other day.  - EKG normal sinus rhythm - Reassurance given - Encourage PO hydration - Encouraged keeping visit tomorrow for prenatal care along with NST - Discussed reasons to come back to MAU  #FWB NST: Reactive    , MD 11/26/21 8:39 PM

## 2021-11-27 ENCOUNTER — Other Ambulatory Visit (HOSPITAL_COMMUNITY)
Admission: RE | Admit: 2021-11-27 | Discharge: 2021-11-27 | Disposition: A | Discharge status: Home / Self Care | Payer: Medicaid Other | Source: Ambulatory Visit

## 2021-11-27 ENCOUNTER — Ambulatory Visit (INDEPENDENT_AMBULATORY_CARE_PROVIDER_SITE_OTHER): Payer: Medicaid Other

## 2021-11-27 ENCOUNTER — Other Ambulatory Visit: Payer: Medicaid Other

## 2021-11-27 ENCOUNTER — Ambulatory Visit: Payer: Medicaid Other | Admitting: *Deleted

## 2021-11-27 VITALS — BP 116/68 | HR 79 | Wt 315.7 lb

## 2021-11-27 DIAGNOSIS — Z3403 Encounter for supervision of normal first pregnancy, third trimester: Secondary | ICD-10-CM

## 2021-11-27 DIAGNOSIS — O99210 Obesity complicating pregnancy, unspecified trimester: Secondary | ICD-10-CM

## 2021-11-27 DIAGNOSIS — Z6841 Body Mass Index (BMI) 40.0 and over, adult: Secondary | ICD-10-CM

## 2021-11-27 DIAGNOSIS — Z3A38 38 weeks gestation of pregnancy: Secondary | ICD-10-CM

## 2021-11-27 DIAGNOSIS — Z5941 Food insecurity: Secondary | ICD-10-CM

## 2021-11-27 DIAGNOSIS — Z3493 Encounter for supervision of normal pregnancy, unspecified, third trimester: Secondary | ICD-10-CM | POA: Insufficient documentation

## 2021-11-27 LAB — OB RESULTS CONSOLE GC/CHLAMYDIA: Gonorrhea: NEGATIVE

## 2021-11-27 NOTE — Progress Notes (Signed)
   PRENATAL VISIT NOTE  Subjective:  Marie Williamson is a 30 y.o. G1P0000 at [redacted]w[redacted]d being seen today for ongoing prenatal care.  She is currently monitored for the following issues for this low-risk pregnancy and has Supervision of low-risk first pregnancy; Elevated blood pressure reading without diagnosis of hypertension; Antepartum bleeding, second trimester; COVID; Marginal insertion of umbilical cord affecting management of mother; and Anemia affecting pregnancy in third trimester on their problem list.  Patient reports no complaints.  Contractions: Irritability. Vag. Bleeding: None.  Movement: Present. Denies leaking of fluid.   The following portions of the patient's history were reviewed and updated as appropriate: allergies, current medications, past family history, past medical history, past social history, past surgical history and problem list.   Objective:   Vitals:   11/27/21 1016  BP: 116/68  Pulse: 79  Weight: (!) 315 lb 11.2 oz (143.2 kg)    Fetal Status: Fetal Heart Rate (bpm): 150   Movement: Present     General:  Alert, oriented and cooperative. Patient is in no acute distress.  Skin: Skin is warm and dry. No rash noted.   Cardiovascular: Normal heart rate noted  Respiratory: Normal respiratory effort, no problems with respiration noted  Abdomen: Soft, gravid, appropriate for gestational age.  Pain/Pressure: Present     Pelvic: Cervical exam deferred        Extremities: Normal range of motion.  Edema: None  Mental Status: Normal mood and affect. Normal behavior. Normal judgment and thought content.   Assessment and Plan:  Pregnancy: G1P0000 at [redacted]w[redacted]d 1. [redacted] weeks gestation of pregnancy  - Cervicovaginal ancillary only( [place]) - Culture, beta strep (group b only)  2. Food insecurity  - AMBULATORY REFERRAL TO [facility]  3. Encounter for supervision of low-risk first pregnancy in third trimester - Routine OB care. Doing well. No concerns - NST  and BPP today - Discussed MFM recommendations for 39-40 week IOL given morbid obesity. Patient prefers spontaneous labor, but open to IOL within 40-41 week range. Patient otherwise low risk. Discussed EPO, RRT, and [employer] for cervical ripening. - IOL scheduled for 12/15  4. Marginal cord insertion - Weekly NST/BPP   5. BMI 45.0-49.9, adult (HCC) - TWG 38lb  Term labor symptoms and general obstetric precautions including but not limited to vaginal bleeding, contractions, leaking of fluid and fetal movement were reviewed in detail with the patient. Please refer to After Visit Summary for other counseling recommendations.   Return in about 9 days (around 12/06/2021) for [facility],  HAs NST/BPP @ 0815.  Future Appointments  Date Time Provider Department Center  11/30/2021  1:15 PM [facility] HEALTH CLINICIAN [facility]  12/06/2021  8:15 AM [facility] NST [facility]  12/06/2021 10:35 AM Crissie Reese, Mary Sella, MD [facility]    Brand Males, CNM

## 2021-11-27 NOTE — Patient Instructions (Signed)
[email or web address] 

## 2021-11-27 NOTE — Progress Notes (Signed)
Pt informed that the ultrasound is considered a limited OB ultrasound and is not intended to be a complete ultrasound exam.  Patient also informed that the ultrasound is not being completed with the intent of assessing for fetal or placental anomalies or any pelvic abnormalities.  Explained that the purpose of today's ultrasound is to assess for presentation, BPP and amniotic fluid volume.  Patient acknowledges the purpose of the exam and the limitations of the study.    Pt here for scheduled NST/BPP however, she had RNST last pm @ MAU.  For today, only BPP is needed.

## 2021-11-29 LAB — CERVICOVAGINAL ANCILLARY ONLY
Chlamydia: NEGATIVE
Comment: NEGATIVE
Comment: NORMAL
Neisseria Gonorrhea: NEGATIVE

## 2021-11-30 ENCOUNTER — Ambulatory Visit (INDEPENDENT_AMBULATORY_CARE_PROVIDER_SITE_OTHER): Payer: Medicaid Other | Admitting: Clinical

## 2021-11-30 DIAGNOSIS — O99340 Other mental disorders complicating pregnancy, unspecified trimester: Secondary | ICD-10-CM

## 2021-11-30 DIAGNOSIS — F4323 Adjustment disorder with mixed anxiety and depressed mood: Secondary | ICD-10-CM

## 2021-11-30 NOTE — Patient Instructions (Signed)
Center for [facility] at [facility] [address] [place], [place] [address] [phone] (main office) [phone] (Omelia Marquart's office)   

## 2021-12-01 ENCOUNTER — Other Ambulatory Visit: Payer: Self-pay

## 2021-12-01 ENCOUNTER — Other Ambulatory Visit (HOSPITAL_COMMUNITY): Payer: Self-pay | Admitting: Advanced Practice Midwife

## 2021-12-01 LAB — CULTURE, BETA STREP (GROUP B ONLY): Strep Gp B Culture: NEGATIVE

## 2021-12-06 ENCOUNTER — Ambulatory Visit (INDEPENDENT_AMBULATORY_CARE_PROVIDER_SITE_OTHER): Payer: Medicaid Other

## 2021-12-06 ENCOUNTER — Encounter: Payer: Self-pay | Admitting: Family Medicine

## 2021-12-06 ENCOUNTER — Ambulatory Visit: Payer: Medicaid Other | Admitting: *Deleted

## 2021-12-06 ENCOUNTER — Other Ambulatory Visit: Payer: Self-pay

## 2021-12-06 ENCOUNTER — Ambulatory Visit (INDEPENDENT_AMBULATORY_CARE_PROVIDER_SITE_OTHER): Payer: Medicaid Other | Admitting: Family Medicine

## 2021-12-06 ENCOUNTER — Other Ambulatory Visit: Payer: Self-pay | Admitting: Advanced Practice Midwife

## 2021-12-06 VITALS — BP 106/70 | HR 78 | Wt 313.5 lb

## 2021-12-06 DIAGNOSIS — O99210 Obesity complicating pregnancy, unspecified trimester: Secondary | ICD-10-CM

## 2021-12-06 DIAGNOSIS — O43199 Other malformation of placenta, unspecified trimester: Secondary | ICD-10-CM

## 2021-12-06 DIAGNOSIS — Z5941 Food insecurity: Secondary | ICD-10-CM

## 2021-12-06 DIAGNOSIS — Z3403 Encounter for supervision of normal first pregnancy, third trimester: Secondary | ICD-10-CM

## 2021-12-06 NOTE — Progress Notes (Signed)
IOL scheduled on 12/19

## 2021-12-06 NOTE — Progress Notes (Signed)
Would like to speak to provider today regarding IOL, what that process is and how it will progress.

## 2021-12-06 NOTE — Progress Notes (Signed)

## 2021-12-06 NOTE — Patient Instructions (Signed)

## 2021-12-06 NOTE — Progress Notes (Signed)
° °  Subjective:  Marie Williamson is a 30 y.o. G1P0000 at [redacted]w[redacted]d being seen today for ongoing prenatal care.  She is currently monitored for the following issues for this low-risk pregnancy and has Supervision of low-risk first pregnancy; Elevated blood pressure reading without diagnosis of hypertension; Antepartum bleeding, second trimester; COVID; Marginal insertion of umbilical cord affecting management of mother; and Anemia affecting pregnancy in third trimester on their problem list.  Patient reports no complaints.  Contractions: Irregular. Vag. Bleeding: None.  Movement: Present. Denies leaking of fluid.   The following portions of the patient's history were reviewed and updated as appropriate: allergies, current medications, past family history, past medical history, past social history, past surgical history and problem list. Problem list updated.  Objective:   Vitals:   12/06/21 0918  BP: 106/70  Pulse: 78  Weight: (!) 313 lb 8 oz (142.2 kg)    Fetal Status: Fetal Heart Rate (bpm): NST   Movement: Present     General:  Alert, oriented and cooperative. Patient is in no acute distress.  Skin: Skin is warm and dry. No rash noted.   Cardiovascular: Normal heart rate noted  Respiratory: Normal respiratory effort, no problems with respiration noted  Abdomen: Soft, gravid, appropriate for gestational age. Pain/Pressure: Present     Pelvic: Vag. Bleeding: None     Cervical exam deferred        Extremities: Normal range of motion.  Edema: None  Mental Status: Normal mood and affect. Normal behavior. Normal judgment and thought content.   Urinalysis:      Assessment and Plan:  Pregnancy: G1P0000 at [redacted]w[redacted]d  1. Encounter for supervision of low-risk first pregnancy in third trimester BP and FHR normal Discussed IOL Many concerns regarding complications, possible need for cesarean, and desire to go into natural labor We discussed MFM recommendations and that they are always done in best  interest of patient and baby, and always to reduce risk of stillbirth or other complications We reviewed ARRIVE trial as well as our practice's strong commitment to preventing the first cesarean We also discussed distressing news stories around increased maternal deaths in black women in the [place], and how our practice is also committed and actively implementing multiple strategies to reduce this health care disparity Patient appreciative of thorough discussion and all questions answered We agreed to keep her IOL date for 12/11/21 scheduled as is, but would be acceptable to move up to [redacted]w[redacted]d if she strongly desires this But hopefully she'll go into labor on her own!  2. Obesity in pregnancy, antepartum BPP 8/10, -2 for breathing  3. Food insecurity  - AMBULATORY REFERRAL TO [facility]  4. Marginal insertion of umbilical cord affecting management of mother Last EFW 38% on 11/14/21  Term labor symptoms and general obstetric precautions including but not limited to vaginal bleeding, contractions, leaking of fluid and fetal movement were reviewed in detail with the patient. Please refer to After Visit Summary for other counseling recommendations.  Return in 6 weeks (on 01/17/2022) for PP check.   Venora Maples, MD

## 2021-12-10 ENCOUNTER — Other Ambulatory Visit: Payer: Self-pay

## 2021-12-10 ENCOUNTER — Encounter (HOSPITAL_COMMUNITY): Payer: Self-pay | Admitting: Obstetrics and Gynecology

## 2021-12-10 ENCOUNTER — Inpatient Hospital Stay (HOSPITAL_COMMUNITY)
Admission: AD | Admit: 2021-12-10 | Discharge: 2021-12-10 | Discharge status: Against Medical Advice | Payer: Medicaid Other | Attending: Obstetrics and Gynecology | Admitting: Obstetrics and Gynecology

## 2021-12-10 DIAGNOSIS — O43199 Other malformation of placenta, unspecified trimester: Secondary | ICD-10-CM | POA: Diagnosis not present

## 2021-12-10 DIAGNOSIS — Z3A40 40 weeks gestation of pregnancy: Secondary | ICD-10-CM | POA: Insufficient documentation

## 2021-12-10 DIAGNOSIS — O368130 Decreased fetal movements, third trimester, not applicable or unspecified: Secondary | ICD-10-CM | POA: Insufficient documentation

## 2021-12-10 DIAGNOSIS — O4692 Antepartum hemorrhage, unspecified, second trimester: Secondary | ICD-10-CM

## 2021-12-10 DIAGNOSIS — Z3403 Encounter for supervision of normal first pregnancy, third trimester: Secondary | ICD-10-CM

## 2021-12-10 DIAGNOSIS — O480 Post-term pregnancy: Secondary | ICD-10-CM | POA: Insufficient documentation

## 2021-12-10 DIAGNOSIS — Z3689 Encounter for other specified antenatal screening: Secondary | ICD-10-CM

## 2021-12-10 NOTE — MAU Note (Signed)
...  Marie Williamson is a 30 y.o. at [redacted]w[redacted]d here in MAU reporting: DFM. She states she went to sleep at 0530 this morning and was feeling her baby move prior but when she woke up around 1100 and noticed her baby wasn't moving. She states she ate and drank fluids and did not feel any movement. Patient states she felt her move once while in the lobby. Patient feeling movements when RN placed patient on FHR monitor. No VB or LOF. Denies pain. Scheduled for an induction tomorrow.   FHT: 155 initial external

## 2021-12-10 NOTE — MAU Provider Note (Signed)
History     CSN: [id number]  Arrival date and time: 12/10/21 1234   Event Date/Time   First Provider Initiated Contact with Patient 12/10/21 1434      Chief Complaint  Patient presents with   Decreased Fetal Movement   HPI [name] is a 30 y.o. year old G57P0000 female at [redacted]w[redacted]d weeks gestation who presents to MAU reporting DFM since waking up this morning. She does reports staying up later than she normally does last night, so she wonders if that has something to do with it. She reports she tried eating and drinking something and did not feel any movement. She felt FM once while waiting in the lobby of MAU, but feeling more movement since being on the monitor. She denies VB or LOF. She denies pain. She is scheduled for IOL on 12/11/2021.  OB History     Gravida  1   Para  0   Term  0   Preterm  0   AB  0   Living  0      SAB  0   IAB  0   Ectopic  0   Multiple  0   Live Births  0           Past Medical History:  Diagnosis Date   Medical history non-contributory     Past Surgical History:  Procedure Laterality Date   NO PAST SURGERIES      Family History  Problem Relation Age of Onset   Diabetes Mother    Congestive Heart Failure Mother    Cancer Mother    Bipolar disorder Mother    Depression Mother    Stroke Maternal Grandmother    Lung cancer Maternal Grandfather     Social History   Tobacco Use   Smoking status: Never   Smokeless tobacco: Never  Vaping Use   Vaping Use: Never used  Substance Use Topics   Alcohol use: Not Currently   Drug use: No    Allergies: No Known Allergies  Medications Prior to Admission  Medication Sig Dispense Refill Last Dose   ferrous sulfate (FERROUSUL) 325 (65 FE) MG tablet Take 1 tablet (325 mg total) by mouth every other day. 30 tablet 1 12/09/2021   Prenatal Vit-Fe Fumarate-FA (PRENATAL MULTIVITAMIN) TABS tablet Take 1 tablet by mouth daily at 12 noon.   12/09/2021    Review of  Systems  Constitutional: Negative.   HENT: Negative.    Eyes: Negative.   Respiratory: Negative.    Cardiovascular: Negative.   Gastrointestinal: Negative.   Endocrine: Negative.   Genitourinary:        DFM  Musculoskeletal: Negative.   Skin: Negative.   Allergic/Immunologic: Negative.   Neurological: Negative.   Hematological: Negative.   Psychiatric/Behavioral: Negative.    Physical Exam   Blood pressure 106/64, pulse 72, temperature 98.2 F (36.8 C), temperature source Oral, resp. rate 20, last menstrual period 03/02/2021, SpO2 99 %.  Physical Exam Vitals and nursing note reviewed.  Constitutional:      Appearance: Normal appearance. She is obese.  Cardiovascular:     Rate and Rhythm: Normal rate.  Pulmonary:     Effort: Pulmonary effort is normal.  Abdominal:     Palpations: Abdomen is soft.  Genitourinary:    Comments: deferred Skin:    General: Skin is warm and dry.  Neurological:     Mental Status: She is alert and oriented to person, place, and time.  Psychiatric:  Mood and Affect: Mood normal.        Behavior: Behavior normal.        Thought Content: Thought content normal.        Judgment: Judgment normal.   REACTIVE NST - FHR: 135 bpm / moderate variability / accels present / decels absent / TOCO: 1 UC every 30-60 mins MAU Course  Procedures  MDM CEFM  1500: In room to discuss admission for IOL today d/t complaint of DFM. Patient states, "Oh, no. I can't do that right now. I am not mentally prepared to be admitted today." Explained to patient that the risk of returning with a fetal demise is increased with a complaint of DFM. Patient verbalizes an understanding of the risks and continues to express desire to be d/c'd home.   1530: Dr. Donavan Foil notified of patient opting to go home instead of being admitted and the risks that were explained.   1531: Informed by Hunt Oris, RN that patient stated she did not understand the risks of leaving. RN  offered to have CNM speak with her again about the risks of leaving vs being admitted for IOL. Patient declined for CNM to come back in room to clarify risks of leaving AMA. AMA form signed by patient.  Assessment and Plan  Decreased fetal movement affecting management of pregnancy in third trimester, single or unspecified fetus  - Advised to return for any DFM or no FM - Information provided on [facility] - AMA form signed by patient   Post term pregnancy over 40 weeks - IOL scheduled for 12/11/2021  [redacted] weeks gestation of pregnancy   - Discharge patient  Raelyn Mora, CNM 12/10/2021, 2:36 PM

## 2021-12-10 NOTE — MAU Note (Signed)
Pt declining admission at this time. Provider states pt needs to sign AMA form due to patient being 40.3 weeks with DFM. Pt states she does not understand the risks to leaving, pt informed by provider and this RN that risk includes fetal death. RN offered for Rolitta CNM to come discuss this with patient again prior to leaving and pt declined saying that either way she was not going to stay for admission. CNM informed. AMA form signed.

## 2021-12-10 NOTE — H&P (Shared)
OBSTETRIC ADMISSION HISTORY AND PHYSICAL  Marie Williamson is a 30 y.o. female G1P0000 with IUP at [redacted]w[redacted]d by *** presenting for ***. She reports +FMs, No LOF, no VB, no blurry vision, headaches or peripheral edema, and RUQ pain.  She plans on *** feeding. She request *** for birth control. She received her prenatal care at {Blank single:19197::"[facility]"}   Dating: By *** --->  Estimated Date of Delivery: 12/07/21  Sono:    @***w***d, CWD, normal anatomy, *** presentation, *** lie, ***g, ***% EFW   Prenatal History/Complications: ***  Past Medical History: Past Medical History:  Diagnosis Date   Medical history non-contributory     Past Surgical History: Past Surgical History:  Procedure Laterality Date   NO PAST SURGERIES      Obstetrical History: OB History     Gravida  1   Para  0   Term  0   Preterm  0   AB  0   Living  0      SAB  0   IAB  0   Ectopic  0   Multiple  0   Live Births  0           Social History Social History   Socioeconomic History   Marital status: Single    Spouse name: Not on file   Number of children: Not on file   Years of education: Not on file   Highest education level: Not on file  Occupational History   Not on file  Tobacco Use   Smoking status: Never   Smokeless tobacco: Never  Vaping Use   Vaping Use: Never used  Substance and Sexual Activity   Alcohol use: Not Currently   Drug use: No   Sexual activity: Yes  Other Topics Concern   Not on file  Social History Narrative   Not on file   Social Determinants of Health   Financial Resource Strain: Not on file  Food Insecurity: Food Insecurity Present   Worried About Running Out of Food in the Last Year: Sometimes true   Ran Out of Food in the Last Year: Sometimes true  Transportation Needs: No Transportation Needs   Lack of Transportation (Medical): No   Lack of Transportation (Non-Medical): No  Physical Activity: Not on file   Stress: Not on file  Social Connections: Not on file    Family History: Family History  Problem Relation Age of Onset   Diabetes Mother    Congestive Heart Failure Mother    Cancer Mother    Bipolar disorder Mother    Depression Mother    Stroke Maternal Grandmother    Lung cancer Maternal Grandfather     Allergies: No Known Allergies  Medications Prior to Admission  Medication Sig Dispense Refill Last Dose   ferrous sulfate (FERROUSUL) 325 (65 FE) MG tablet Take 1 tablet (325 mg total) by mouth every other day. 30 tablet 1 12/09/2021   Prenatal Vit-Fe Fumarate-FA (PRENATAL MULTIVITAMIN) TABS tablet Take 1 tablet by mouth daily at 12 noon.   12/09/2021     Review of Systems   All systems reviewed and negative except as stated in HPI  Blood pressure 106/64, pulse 72, temperature 98.2 F (36.8 C), temperature source Oral, resp. rate 20, last menstrual period 03/02/2021, SpO2 99 %. General appearance: {general exam:16600} Lungs: clear to auscultation bilaterally Heart: regular rate and rhythm Abdomen: soft, non-tender; bowel sounds normal Pelvic: *** Extremities: Homans sign is negative, no sign of DVT  DTR's *** Presentation: {desc; fetal presentation:14558} Fetal monitoring{findings; monitor fetal heart monitor:31527} Uterine activity{Uterine contractions:31516}     Prenatal labs: ABO, Rh: A/Positive/-- (05/31 1029) Antibody: Negative (05/31 1029) Rubella: 6.94 (05/31 1029) RPR: Non Reactive (09/27 0839)  HBsAg: Negative (05/31 1029)  HIV: Non Reactive (09/27 0839)  GBS: Negative/-- (12/05 1204)  1 hr Glucola *** Genetic screening  *** Anatomy US ***  Prenatal Transfer Tool  Maternal Diabetes: {Maternal Diabetes:3043596} Genetic Screening: {Genetic Screening:20205} Maternal Ultrasounds/Referrals: {Maternal Ultrasounds / Referrals:20211} Fetal Ultrasounds or other Referrals:  {Fetal Ultrasounds or Other Referrals:20213} Maternal Substance Abuse:  {Maternal  Substance Abuse:20223} Significant Maternal Medications:  {Significant Maternal Meds:20233} Significant Maternal Lab Results: {Significant Maternal Lab Results:20235}  No results found for this or any previous visit (from the past 24 hour(s)).  Patient Active Problem List   Diagnosis Date Noted   Anemia affecting pregnancy in third trimester 11/26/2021   Marginal insertion of umbilical cord affecting management of mother 07/14/2021   COVID 06/07/2021   Antepartum bleeding, second trimester 06/01/2021   Supervision of low-risk first pregnancy 05/23/2021   Elevated blood pressure reading without diagnosis of hypertension 05/23/2021    Assessment/Plan:  ROBBI SPELLS is a 30 y.o. G1P0000 at [redacted]w[redacted]d here for***  #Labor:*** #Pain: *** #FWB: *** #ID:  *** #MOF: *** #MOC:*** #Circ:  Warner Mccreedy, MD  12/10/2021, 2:57 PM

## 2021-12-11 ENCOUNTER — Inpatient Hospital Stay (HOSPITAL_COMMUNITY): Payer: Medicaid Other | Attending: Family Medicine

## 2021-12-11 NOTE — BH Specialist Note (Signed)
Integrated Behavioral Health via Telemedicine Visit  12/11/2021 Marie Williamson [id number]  Number of Integrated Behavioral Health visits: 5 Session Start time: 1:16 Session End time: 1:38 Total time:  22  Referring Provider: Nolene Bernheim, NP Patient/Family location: Home [facility] Provider location: Center for [facility]  All persons participating in visit: [name] and [facility] Kerney Hopfensperger   Types of Service: Individual psychotherapy and Telephone visit  I connected with Marie Williamson and/or Marie Williamson's  n/a  via  Telephone or Video Enabled Telemedicine Application  (Video is Caregility application) and verified that I am speaking with the correct person using two identifiers. Discussed confidentiality: Yes   I discussed the limitations of telemedicine and the availability of in person appointments.  Discussed there is a possibility of technology failure and discussed alternative modes of communication if that failure occurs.  I discussed that engaging in this telemedicine visit, they consent to the provision of behavioral healthcare and the services will be billed under their insurance.  Patient and/or legal guardian expressed understanding and consented to Telemedicine visit: Yes   Presenting Concerns: Patient and/or family reports the following symptoms/concerns: Lack of quality sleep; fiance and mother have been very supportive; processing positive birthing experience. Duration of problem: Postpartum; Severity of problem: mild  Patient and/or Family's Strengths/Protective Factors: Social connections, Concrete supports in place (healthy food, safe environments, etc.), Sense of purpose, and Physical Health (exercise, healthy diet, medication compliance, etc.)  Goals Addressed: Patient will:  Increase knowledge and/or ability of: healthy habits   Demonstrate ability to: Increase healthy adjustment to current life  circumstances  Progress towards Goals: Ongoing  Interventions: Interventions utilized:  Sleep Hygiene Standardized Assessments completed: Not Needed  Patient and/or Family Response: Pt agrees with treatment plan  Assessment: Patient currently experiencing Adjustment disorder with mixed anxious and depressed mood.   Patient may benefit from continued brief therapeutic intervention.  Plan: Follow up with behavioral health clinician on : Two weeks Behavioral recommendations:  -For next two weeks, sleep when baby sleeps(especially during baby's morning big nap time) -Accept practical support from friends and family, as offered Referral(s): Integrated [employer] (In Clinic)  I discussed the assessment and treatment plan with the patient and/or parent/guardian. They were provided an opportunity to ask questions and all were answered. They agreed with the plan and demonstrated an understanding of the instructions.   They were advised to call back or seek an in-person evaluation if the symptoms worsen or if the condition fails to improve as anticipated.  Valetta Close Karyna Bessler, LCSW

## 2021-12-12 NOTE — Progress Notes (Signed)
Marie Williamson, NS called an alternate phone number for the patient. The patient's mother answered the phone and says she doesn't think the patient is coming. Marie Williamson requested the mother let her daughter know to give [place] a call back to L&D.

## 2021-12-12 NOTE — Progress Notes (Signed)
This patient was scheduled for a IOL on 12/11/2021. The night shift [place] called this patient at 2300 last night and asked her to come in for IOL. The patient declined to come. The night shift [place] called the patient back at 0500 this AM and left her a voice message. I attempted to call the patient at [phone] (12/12/2021) and the patient did not answer. I also left the patient a voice message asking her to return my call.

## 2021-12-13 ENCOUNTER — Telehealth: Payer: Self-pay | Admitting: Family Medicine

## 2021-12-13 ENCOUNTER — Encounter: Payer: Self-pay | Admitting: Certified Nurse Midwife

## 2021-12-13 ENCOUNTER — Other Ambulatory Visit: Payer: Self-pay | Admitting: Advanced Practice Midwife

## 2021-12-13 DIAGNOSIS — O99210 Obesity complicating pregnancy, unspecified trimester: Secondary | ICD-10-CM | POA: Insufficient documentation

## 2021-12-13 NOTE — Progress Notes (Incomplete)
Called pt to inform the

## 2021-12-13 NOTE — Telephone Encounter (Signed)
Contacted the patient.    Patient reports having cramping and contractions. She had sent a message to [facility] about having a membrane sweep. I counseled the patient that a membranes sweeping may help.   I reviewed the indications for IOL and the process of induction with cytotec/FB/pitocin.   I contacted CNM to see if she can be worked into clinic for sweeping. Patient prefers to come in for IOL tomorrow when 41 wks.  Reviewed that LD will call patient in the AM to get her into the hospital. I reviewed she will come to the same entrance for the MAU. She asked about bringing her bag and I recommended doing this.   She was reassured and voiced understanding about need to answer phone calls and to come to hospital tomorrow.

## 2021-12-14 ENCOUNTER — Inpatient Hospital Stay (HOSPITAL_COMMUNITY)
Admission: AD | Admit: 2021-12-14 | Discharge: 2021-12-17 | DRG: 768 | Disposition: A | Discharge status: Home / Self Care | Payer: Medicaid Other | Attending: Obstetrics and Gynecology | Admitting: Obstetrics and Gynecology

## 2021-12-14 ENCOUNTER — Inpatient Hospital Stay (HOSPITAL_COMMUNITY): Payer: Medicaid Other

## 2021-12-14 ENCOUNTER — Encounter (HOSPITAL_COMMUNITY): Payer: Self-pay | Admitting: Family Medicine

## 2021-12-14 ENCOUNTER — Other Ambulatory Visit: Payer: Self-pay

## 2021-12-14 DIAGNOSIS — O43123 Velamentous insertion of umbilical cord, third trimester: Secondary | ICD-10-CM | POA: Diagnosis present

## 2021-12-14 DIAGNOSIS — Z3A41 41 weeks gestation of pregnancy: Secondary | ICD-10-CM

## 2021-12-14 DIAGNOSIS — O9081 Anemia of the puerperium: Secondary | ICD-10-CM | POA: Diagnosis not present

## 2021-12-14 DIAGNOSIS — Z20822 Contact with and (suspected) exposure to covid-19: Secondary | ICD-10-CM | POA: Diagnosis present

## 2021-12-14 DIAGNOSIS — O99214 Obesity complicating childbirth: Secondary | ICD-10-CM | POA: Diagnosis present

## 2021-12-14 DIAGNOSIS — Z8616 Personal history of COVID-19: Secondary | ICD-10-CM

## 2021-12-14 DIAGNOSIS — O480 Post-term pregnancy: Secondary | ICD-10-CM | POA: Diagnosis present

## 2021-12-14 DIAGNOSIS — O4202 Full-term premature rupture of membranes, onset of labor within 24 hours of rupture: Secondary | ICD-10-CM | POA: Diagnosis not present

## 2021-12-14 DIAGNOSIS — D62 Acute posthemorrhagic anemia: Secondary | ICD-10-CM | POA: Diagnosis not present

## 2021-12-14 LAB — CBC
HCT: 35.1 % — ABNORMAL LOW (ref 36.0–46.0)
Hemoglobin: 10.9 g/dL — ABNORMAL LOW (ref 12.0–15.0)
MCH: 27.7 pg (ref 26.0–34.0)
MCHC: 31.1 g/dL (ref 30.0–36.0)
MCV: 89.1 fL (ref 80.0–100.0)
Platelets: 301 10*3/uL (ref 150–400)
RBC: 3.94 MIL/uL (ref 3.87–5.11)
RDW: 14.2 % (ref 11.5–15.5)
WBC: 11.1 10*3/uL — ABNORMAL HIGH (ref 4.0–10.5)
nRBC: 0 % (ref 0.0–0.2)

## 2021-12-14 LAB — RESP PANEL BY RT-PCR (FLU A&B, COVID) ARPGX2
Influenza A by PCR: NEGATIVE
Influenza B by PCR: NEGATIVE
SARS Coronavirus 2 by RT PCR: NEGATIVE

## 2021-12-14 LAB — TYPE AND SCREEN
ABO/RH(D): A POS
Antibody Screen: NEGATIVE

## 2021-12-14 MED ORDER — OXYTOCIN BOLUS FROM INFUSION
333.0000 mL | Freq: Once | INTRAVENOUS | Status: AC
  Administered 2021-12-15: 06:00:00 333 mL via INTRAVENOUS

## 2021-12-14 MED ORDER — MISOPROSTOL 25 MCG QUARTER TABLET: 25.0000 ug | ORAL_TABLET | ORAL | Status: DC | PRN

## 2021-12-14 MED ORDER — ONDANSETRON HCL 4 MG/2ML IJ SOLN: 4.0000 mg | Freq: Four times a day (QID) | INTRAMUSCULAR | Status: DC | PRN

## 2021-12-14 MED ORDER — TERBUTALINE SULFATE 1 MG/ML IJ SOLN: 0.2500 mg | Freq: Once | INTRAMUSCULAR | Status: DC | PRN

## 2021-12-14 MED ORDER — LACTATED RINGERS IV SOLN: 500.0000 mL | INTRAVENOUS | Status: DC | PRN

## 2021-12-14 MED ORDER — LACTATED RINGERS IV SOLN: INTRAVENOUS | Status: DC

## 2021-12-14 MED ORDER — OXYCODONE-ACETAMINOPHEN 5-325 MG PO TABS: 2.0000 | ORAL_TABLET | ORAL | Status: DC | PRN

## 2021-12-14 MED ORDER — OXYTOCIN-SODIUM CHLORIDE 30-0.9 UT/500ML-% IV SOLN
2.5000 [IU]/h | INTRAVENOUS | Status: DC
  Filled 2021-12-14: qty 500

## 2021-12-14 MED ORDER — LIDOCAINE HCL (PF) 1 % IJ SOLN
30.0000 mL | INTRAMUSCULAR | Status: DC | PRN
  Filled 2021-12-14: qty 30

## 2021-12-14 MED ORDER — SOD CITRATE-CITRIC ACID 500-334 MG/5ML PO SOLN: 30.0000 mL | ORAL | Status: DC | PRN

## 2021-12-14 MED ORDER — MISOPROSTOL 50MCG HALF TABLET
50.0000 ug | ORAL_TABLET | ORAL | Status: DC | PRN
  Administered 2021-12-14 – 2021-12-15 (×3): 50 ug via BUCCAL
  Filled 2021-12-14 (×3): qty 1

## 2021-12-14 MED ORDER — OXYCODONE-ACETAMINOPHEN 5-325 MG PO TABS: 1.0000 | ORAL_TABLET | ORAL | Status: DC | PRN

## 2021-12-14 MED ORDER — ACETAMINOPHEN 325 MG PO TABS: 650.0000 mg | ORAL_TABLET | ORAL | Status: DC | PRN

## 2021-12-14 NOTE — Progress Notes (Signed)
Patient Vitals for the past 4 hrs:  BP Temp Temp src Pulse  12/14/21 1927 -- 98 F (36.7 C) Oral --  12/14/21 1905 109/77 -- -- 83   Feeling mild ctx, coupling q 3-5 minutes.  FHR Cat 1.  Cx 2.5/50/-2.  Cooks catheter inserted and inflated w/80cc H20.  Plan another buccal cytotec.

## 2021-12-14 NOTE — H&P (Signed)
OBSTETRIC ADMISSION HISTORY AND PHYSICAL  Marie Williamson is a 30 y.o. female G1P0000 with IUP at [redacted]w[redacted]d by LMP presenting for post-dates IOL. She reports +FMs, No LOF, no VB, no blurry vision, headaches or peripheral edema, and RUQ pain.  She plans on breastfeeding. She request phexxi for birth control. She received her prenatal care at [facility]. Prenatal records reviewed.  Dating: By LMP --->  Estimated Date of Delivery: 12/07/21  Sono @[redacted]w[redacted]d , CWD, normal anatomy, cephalic presentation, posterior placenta, 2861g, 39% EFW 6lb 5oz  Prenatal History/Complications: marginal cord insertion  Past Medical History: Past Medical History:  Diagnosis Date   Medical history non-contributory    Past Surgical History: Past Surgical History:  Procedure Laterality Date   NO PAST SURGERIES     Obstetrical History: OB History     Gravida  1   Para  0   Term  0   Preterm  0   AB  0   Living  0      SAB  0   IAB  0   Ectopic  0   Multiple  0   Live Births  0          Social History Social History   Socioeconomic History   Marital status: Single    Spouse name: Not on file   Number of children: Not on file   Years of education: Not on file   Highest education level: Not on file  Occupational History   Not on file  Tobacco Use   Smoking status: Never   Smokeless tobacco: Never  Vaping Use   Vaping Use: Never used  Substance and Sexual Activity   Alcohol use: Not Currently   Drug use: No   Sexual activity: Yes  Other Topics Concern   Not on file  Social History Narrative   Not on file   Social Determinants of Health   Financial Resource Strain: Not on file  Food Insecurity: Food Insecurity Present   Worried About Running Out of Food in the Last Year: Sometimes true   Ran Out of Food in the Last Year: Sometimes true  Transportation Needs: No Transportation Needs   Lack of Transportation (Medical): No   Lack of Transportation (Non-Medical): No  Physical  Activity: Not on file  Stress: Not on file  Social Connections: Not on file   Family History: Family History  Problem Relation Age of Onset   Diabetes Mother    Congestive Heart Failure Mother    Cancer Mother    Bipolar disorder Mother    Depression Mother    Stroke Maternal Grandmother    Lung cancer Maternal Grandfather    Allergies: No Known Allergies  Medications Prior to Admission  Medication Sig Dispense Refill Last Dose   ferrous sulfate (FERROUSUL) 325 (65 FE) MG tablet Take 1 tablet (325 mg total) by mouth every other day. 30 tablet 1 Past Week at 1200   Prenatal Vit-Fe Fumarate-FA (PRENATAL MULTIVITAMIN) TABS tablet Take 1 tablet by mouth daily at 12 noon.   Past Week at 1200   Review of Systems   All systems reviewed and negative except as stated in HPI  Blood pressure 109/77, pulse 83, temperature 98.2 F (36.8 C), temperature source Oral, resp. rate 17, height 5\' 7"  (1.702 m), weight (!) 317 lb 14.4 oz (144.2 kg), last menstrual period 03/02/2021. General appearance: alert, cooperative, appears stated age, and no distress Lungs: clear to auscultation bilaterally Heart: regular rate and rhythm Abdomen: soft,  non-tender; bowel sounds normal Pelvic: Normal external female genitalia, no blood, physiologic discharge, no leaking of fluid, pelvis adequate for delivery Extremities: Homans sign is negative, no sign of DVT DTR's normal Presentation: cephalic Fetal monitoringBaseline: 150 bpm, Variability: Good {> 6 bpm), Accelerations: Reactive, and Decelerations: Absent Uterine activity: irritability Dilation: 2 Effacement (%): Thick Station: -3 Exam by:: Edd Arbour, CNM  Prenatal labs: ABO, Rh: A/Positive/-- (05/31 1029) Antibody: Negative (05/31 1029) Rubella: 6.94 (05/31 1029) RPR: Non Reactive (09/27 0839)  HBsAg: Negative (05/31 1029)  HIV: Non Reactive (09/27 0839)  GBS: Negative/-- (12/05 1204)  2 hr GTT: 72/94/76 Genetic screening: LR NIPS,  negative Horizon, AFP negative Anatomy US normal  Prenatal Transfer Tool  Maternal Diabetes: No Genetic Screening: Normal Maternal Ultrasounds/Referrals: Normal Fetal Ultrasounds or other Referrals:  None Maternal Substance Abuse:  No Significant Maternal Medications:  None Significant Maternal Lab Results: Group B Strep negative  Results for orders placed or performed during the hospital encounter of 12/14/21 (from the past 24 hour(s))  CBC   Collection Time: 12/14/21  4:36 PM  Result Value Ref Range   WBC 11.1 (H) 4.0 - 10.5 K/uL   RBC 3.94 3.87 - 5.11 MIL/uL   Hemoglobin 10.9 (L) 12.0 - 15.0 g/dL   HCT 29.5 (L) 28.4 - 13.2 %   MCV 89.1 80.0 - 100.0 fL   MCH 27.7 26.0 - 34.0 pg   MCHC 31.1 30.0 - 36.0 g/dL   RDW 44.0 10.2 - 72.5 %   Platelets 301 150 - 400 K/uL   nRBC 0.0 0.0 - 0.2 %  Resp Panel by RT-PCR (Flu A&B, Covid) Nasopharyngeal Swab   Collection Time: 12/14/21  4:47 PM   Specimen: Nasopharyngeal Swab; Nasopharyngeal(NP) swabs in vial transport medium  Result Value Ref Range   SARS Coronavirus 2 by RT PCR NEGATIVE NEGATIVE   Influenza A by PCR NEGATIVE NEGATIVE   Influenza B by PCR NEGATIVE NEGATIVE    Patient Active Problem List   Diagnosis Date Noted   Indication for care in labor or delivery 12/14/2021   Obesity affecting pregnancy, antepartum 12/13/2021   Anemia affecting pregnancy in third trimester 11/26/2021   Marginal insertion of umbilical cord affecting management of mother 07/14/2021   COVID 06/07/2021   Antepartum bleeding, second trimester 06/01/2021   Supervision of low-risk first pregnancy 05/23/2021   Elevated blood pressure reading without diagnosis of hypertension 05/23/2021    Assessment/Plan:  Marie Williamson is a 30 y.o. G1P0000 at [redacted]w[redacted]d here for post-dates IOL.  #Labor: Latent, will begin induction with buccal cytotec and walking #Pain: Well-controlled with deep breathing and FOB support #FWB: Cat 1 #ID:  GBS negative #MOF:  breast #MOC: phexxi #Circ:  N/A  Bernerd Limbo, CNM  12/14/2021, 7:19 PM

## 2021-12-15 ENCOUNTER — Encounter (HOSPITAL_COMMUNITY): Payer: Self-pay | Admitting: Family Medicine

## 2021-12-15 DIAGNOSIS — O480 Post-term pregnancy: Secondary | ICD-10-CM

## 2021-12-15 DIAGNOSIS — Z3A41 41 weeks gestation of pregnancy: Secondary | ICD-10-CM

## 2021-12-15 DIAGNOSIS — O4202 Full-term premature rupture of membranes, onset of labor within 24 hours of rupture: Secondary | ICD-10-CM

## 2021-12-15 LAB — RPR: RPR Ser Ql: NONREACTIVE

## 2021-12-15 LAB — CBC
HCT: 31.9 % — ABNORMAL LOW (ref 36.0–46.0)
Hemoglobin: 10 g/dL — ABNORMAL LOW (ref 12.0–15.0)
MCH: 27.9 pg (ref 26.0–34.0)
MCHC: 31.3 g/dL (ref 30.0–36.0)
MCV: 88.9 fL (ref 80.0–100.0)
Platelets: 266 10*3/uL (ref 150–400)
RBC: 3.59 MIL/uL — ABNORMAL LOW (ref 3.87–5.11)
RDW: 14.1 % (ref 11.5–15.5)
WBC: 13.3 10*3/uL — ABNORMAL HIGH (ref 4.0–10.5)
nRBC: 0 % (ref 0.0–0.2)

## 2021-12-15 MED ORDER — WITCH HAZEL-GLYCERIN EX PADS: 1.0000 "application " | MEDICATED_PAD | CUTANEOUS | Status: DC | PRN

## 2021-12-15 MED ORDER — IBUPROFEN 600 MG PO TABS
600.0000 mg | ORAL_TABLET | Freq: Four times a day (QID) | ORAL | Status: DC
  Administered 2021-12-15 – 2021-12-17 (×8): 600 mg via ORAL
  Filled 2021-12-15 (×9): qty 1

## 2021-12-15 MED ORDER — OXYTOCIN-SODIUM CHLORIDE 30-0.9 UT/500ML-% IV SOLN
INTRAVENOUS | Status: AC
  Filled 2021-12-15: qty 500

## 2021-12-15 MED ORDER — SENNOSIDES-DOCUSATE SODIUM 8.6-50 MG PO TABS
2.0000 | ORAL_TABLET | Freq: Every day | ORAL | Status: DC
  Administered 2021-12-16 – 2021-12-17 (×2): 2 via ORAL
  Filled 2021-12-15 (×2): qty 2

## 2021-12-15 MED ORDER — DIPHENHYDRAMINE HCL 50 MG/ML IJ SOLN: 12.5000 mg | INTRAMUSCULAR | Status: DC | PRN

## 2021-12-15 MED ORDER — ACETAMINOPHEN 325 MG PO TABS
650.0000 mg | ORAL_TABLET | ORAL | Status: DC | PRN
  Administered 2021-12-16: 21:00:00 650 mg via ORAL
  Filled 2021-12-15: qty 2

## 2021-12-15 MED ORDER — COCONUT OIL OIL: 1.0000 "application " | TOPICAL_OIL | Status: DC | PRN

## 2021-12-15 MED ORDER — PHENYLEPHRINE 40 MCG/ML (10ML) SYRINGE FOR IV PUSH (FOR BLOOD PRESSURE SUPPORT): 80.0000 ug | PREFILLED_SYRINGE | INTRAVENOUS | Status: DC | PRN

## 2021-12-15 MED ORDER — MISOPROSTOL 200 MCG PO TABS
ORAL_TABLET | ORAL | Status: AC
  Filled 2021-12-15: qty 5

## 2021-12-15 MED ORDER — ONDANSETRON HCL 4 MG PO TABS: 4.0000 mg | ORAL_TABLET | ORAL | Status: DC | PRN

## 2021-12-15 MED ORDER — MISOPROSTOL 200 MCG PO TABS
800.0000 ug | ORAL_TABLET | Freq: Once | ORAL | Status: AC
  Administered 2021-12-15: 06:00:00 800 ug via RECTAL

## 2021-12-15 MED ORDER — TRANEXAMIC ACID-NACL 1000-0.7 MG/100ML-% IV SOLN
INTRAVENOUS | Status: AC
  Filled 2021-12-15: qty 100

## 2021-12-15 MED ORDER — ONDANSETRON HCL 4 MG/2ML IJ SOLN: 4.0000 mg | INTRAMUSCULAR | Status: DC | PRN

## 2021-12-15 MED ORDER — EPHEDRINE 5 MG/ML INJ: 10.0000 mg | INTRAVENOUS | Status: DC | PRN

## 2021-12-15 MED ORDER — LACTATED RINGERS IV SOLN: 500.0000 mL | Freq: Once | INTRAVENOUS | Status: DC

## 2021-12-15 MED ORDER — SIMETHICONE 80 MG PO CHEW: 80.0000 mg | CHEWABLE_TABLET | ORAL | Status: DC | PRN

## 2021-12-15 MED ORDER — MEDROXYPROGESTERONE ACETATE 150 MG/ML IM SUSP: 150.0000 mg | INTRAMUSCULAR | Status: DC | PRN

## 2021-12-15 MED ORDER — MEASLES, MUMPS & RUBELLA VAC IJ SOLR: 0.5000 mL | Freq: Once | INTRAMUSCULAR | Status: DC

## 2021-12-15 MED ORDER — BENZOCAINE-MENTHOL 20-0.5 % EX AERO: 1.0000 "application " | INHALATION_SPRAY | CUTANEOUS | Status: DC | PRN

## 2021-12-15 MED ORDER — PRENATAL MULTIVITAMIN CH
1.0000 | ORAL_TABLET | Freq: Every day | ORAL | Status: DC
  Administered 2021-12-15 – 2021-12-17 (×3): 1 via ORAL
  Filled 2021-12-15 (×3): qty 1

## 2021-12-15 MED ORDER — FENTANYL CITRATE (PF) 100 MCG/2ML IJ SOLN
100.0000 ug | INTRAMUSCULAR | Status: DC | PRN
  Administered 2021-12-15: 05:00:00 100 ug via INTRAVENOUS
  Filled 2021-12-15: qty 2

## 2021-12-15 MED ORDER — DIBUCAINE (PERIANAL) 1 % EX OINT: 1.0000 "application " | TOPICAL_OINTMENT | CUTANEOUS | Status: DC | PRN

## 2021-12-15 MED ORDER — TETANUS-DIPHTH-ACELL PERTUSSIS 5-2.5-18.5 LF-MCG/0.5 IM SUSY: 0.5000 mL | PREFILLED_SYRINGE | Freq: Once | INTRAMUSCULAR | Status: DC

## 2021-12-15 MED ORDER — TRANEXAMIC ACID-NACL 1000-0.7 MG/100ML-% IV SOLN: 1000.0000 mg | INTRAVENOUS | Status: DC

## 2021-12-15 MED ORDER — FENTANYL-BUPIVACAINE-NACL 0.5-0.125-0.9 MG/250ML-% EP SOLN: 12.0000 mL/h | EPIDURAL | Status: DC | PRN

## 2021-12-15 MED ORDER — TRANEXAMIC ACID-NACL 1000-0.7 MG/100ML-% IV SOLN
1000.0000 mg | INTRAVENOUS | Status: AC
  Administered 2021-12-15: 06:00:00 1000 mg via INTRAVENOUS

## 2021-12-15 MED ORDER — DIPHENHYDRAMINE HCL 25 MG PO CAPS: 25.0000 mg | ORAL_CAPSULE | Freq: Four times a day (QID) | ORAL | Status: DC | PRN

## 2021-12-15 MED ORDER — CEFAZOLIN SODIUM-DEXTROSE 2-4 GM/100ML-% IV SOLN
2.0000 g | Freq: Once | INTRAVENOUS | Status: AC
  Administered 2021-12-15: 09:00:00 2 g via INTRAVENOUS
  Filled 2021-12-15: qty 100

## 2021-12-15 NOTE — Lactation Note (Signed)
This note was copied from a baby's chart. Lactation Consultation Note  Patient Name: Girl Vesper Trant [id number] Date: 12/15/2021 Reason for consult: L&D Initial assessment;1st time breastfeeding;Primapara;Term Age:29 hours   Initial L&D Consult:  RN returned call for latch assistance.  Baby "Journey" was awake and showing cues.  Assisted to latch and observed her feeding for 7 minutes prior to leaving the room.  Reassured parents that lactation services will be available on the M/B unit.  Father present.  Allowed time for family bonding.   Maternal Data    Feeding Mother's Current Feeding Choice: Breast Milk  LATCH Score Latch: Repeated attempts needed to sustain latch, nipple held in mouth throughout feeding, stimulation needed to elicit sucking reflex.  Audible Swallowing: None  Type of Nipple: Everted at rest and after stimulation  Comfort (Breast/Nipple): Soft / non-tender  Hold (Positioning): Assistance needed to correctly position infant at breast and maintain latch.  LATCH Score: 6   Lactation Tools Discussed/Used    Interventions Interventions: Assisted with latch;Skin to skin  Discharge    Consult Status Consult Status: Follow-up from L&D    Irene Pap Pritika Alvarez 12/15/2021, 6:58 AM

## 2021-12-15 NOTE — Discharge Summary (Signed)
Postpartum Discharge Summary  Date of Service updated     Patient Name: Marie Williamson DOB: [birth year] MRN: [id number]  Date of admission: 12/14/2021 Delivery date:12/15/2021  Delivering provider: Christin Fudge  Date of discharge: 12/17/2021  Admitting diagnosis: Indication for care in labor or delivery [O75.9] Intrauterine pregnancy: [redacted]w[redacted]d    Secondary diagnosis:  Principal Problem:   Indication for care in labor or delivery  Additional problems: postdates    Discharge diagnosis: Term Pregnancy Delivered                                              Post partum procedures: Jada for threatened PPH/boggy uterus Augmentation: Cytotec and IP Foley Complications: None  Hospital course: Induction of Labor With Vaginal Delivery   30y.o. yo G1P0000 at 480w1das admitted to the hospital 12/14/2021 for induction of labor.  Indication for induction: Postdates.  Patient had an uncomplicated labor course as follows: Membrane Rupture Time/Date: 5:19 AM ,12/15/2021   Delivery Method:Vaginal, Spontaneous  Episiotomy: None  Lacerations:  Perineal;1st degree  Details of delivery can be found in separate delivery note.  Patient had a routine postpartum course. Patient is discharged home 12/17/21.  Newborn Data: Birth date:12/15/2021  Birth time:5:39 AM  Gender:Female  Living status:Living  Apgars:8 ,9  Weight:3410 g   Magnesium Sulfate received: No BMZ received: No Rhophylac:N/A MMR:N/A T-DaP:Given prenatally Flu: Yes Transfusion:No  Physical exam  Vitals:   12/16/21 0446 12/16/21 1500 12/16/21 2115 12/17/21 0510  BP: 107/68 (!) 103/55 (!) 95/56 107/68  Pulse: 63 64 75 61  Resp: 17 16 17 17   Temp: 98 F (36.7 C) 98.2 F (36.8 C) 97.6 F (36.4 C) 97.9 F (36.6 C)  TempSrc: Oral Oral Oral Oral  SpO2: 100% 100% 100% 100%  Weight:      Height:       General: alert, cooperative, and no distress Lochia: appropriate Uterine Fundus: firm Incision: N/A DVT  Evaluation: No evidence of DVT seen on physical exam. No cords or calf tenderness. Labs: Lab Results  Component Value Date   WBC 15.3 (H) 12/16/2021   HGB 9.9 (L) 12/16/2021   HCT 31.2 (L) 12/16/2021   MCV 89.1 12/16/2021   PLT 281 12/16/2021   CMP Latest Ref Rng & Units 05/23/2021  Glucose 65 - 99 mg/dL 78  BUN 6 - 20 mg/dL 7  Creatinine 0.57 - 1.00 mg/dL 0.59  Sodium 134 - 144 mmol/L 134  Potassium 3.5 - 5.2 mmol/L 3.9  Chloride 96 - 106 mmol/L 99  CO2 20 - 29 mmol/L 19(L)  Calcium 8.7 - 10.2 mg/dL 9.1  Total Protein 6.0 - 8.5 g/dL 7.1  Total Bilirubin 0.0 - 1.2 mg/dL 0.2  Alkaline Phos 44 - 121 IU/L 75  AST 0 - 40 IU/L 11  ALT 0 - 32 IU/L 20   Edinburgh Score: No flowsheet data found.   After visit meds:  Allergies as of 12/17/2021   No Known Allergies      Medication List     TAKE these medications    acetaminophen 325 MG tablet Commonly known as: Tylenol Take 2 tablets (650 mg total) by mouth every 4 (four) hours as needed (for pain scale < 4).   ferrous sulfate 325 (65 FE) MG tablet Commonly known as: FerrouSul Take 1 tablet (325 mg total) by mouth every other  day.   ibuprofen 600 MG tablet Commonly known as: ADVIL Take 1 tablet (600 mg total) by mouth every 6 (six) hours as needed.   PRENATAL GUMMIES PO Take 1 tablet by mouth every morning.         Discharge home in stable condition Infant Feeding: Breast Infant Disposition:home with mother Discharge instruction: per After Visit Summary and Postpartum booklet. Activity: Advance as tolerated. Pelvic rest for 6 weeks.  Diet: routine diet Future Appointments: Future Appointments  Date Time Provider [place]  12/21/2021  1:15 PM [place] [facility]  01/17/2022 10:35 AM Gabriel Carina, CNM [facility]   Follow up Visit:   Please schedule this patient for a In person postpartum visit in 4 weeks with the following provider: Any provider. Additional  Postpartum F/U:   Low risk pregnancy complicated by:  Delivery mode:  Vaginal, Spontaneous  Anticipated Birth Control:   phexxi   12/17/2021 Patriciaann Clan, DO

## 2021-12-15 NOTE — Progress Notes (Signed)
Labor Progress Note MAIRI STAGLIANO is a 30 y.o. G1P0000 at [redacted]w[redacted]d presented for   S: Laying in bed resting.  O:  BP 120/66    Pulse 78    Temp 98 F (36.7 C) (Oral)    Resp 17    Ht 5\' 7"  (1.702 m)    Wt (!) 144.2 kg    LMP 03/02/2021 (Exact Date)    BMI 49.79 kg/m   FHT: FHR 145 bpm, moderate variability, + accels, no decels  CVE: Dilation: 2.5 Effacement (%): 50 Station: -2 Presentation: Vertex Exam by:: [email or web address] C-D, CNM   A&P: 30 y.o. G1P0000 [redacted]w[redacted]d  #Labor: Continuing with current management. Cooks in place. S/p cytotec x3.  #Pain: Per patient request #FWB: Cat 1 #GBS negative   Corbin Falck, DO 3:11 AM

## 2021-12-15 NOTE — Lactation Note (Signed)
This note was copied from a baby's chart. Lactation Consultation Note  Patient Name: Marie Williamson [id number] Date: 12/15/2021 Reason for consult: L&D Initial assessment;1st time breastfeeding;Primapara;Term Age:29 hours   Initial L&D Consult:  Attempted to visit with family, however, staff members still attending to patient's needs.  Asked staff member to call for lactation consultant when ready.   Maternal Data    Feeding Mother's Current Feeding Choice: Breast Milk  LATCH Score                    Lactation Tools Discussed/Used    Interventions    Discharge    Consult Status      Marie Williamson 12/15/2021, 6:25 AM

## 2021-12-15 NOTE — Lactation Note (Signed)
This note was copied from a baby's chart. Lactation Consultation Note  Patient Name: Marie Williamson [id number] Date: 12/15/2021 Reason for consult: Initial assessment;Mother's request;Primapara;1st time breastfeeding;Term;Breastfeeding assistance Age:29 hours  On arrival Mom had infant in cradle attempting a latch. Infant 15 min feeding one hr prior and was not interested in latching.  Mom stated infant had 2 feedings since on the floor.   Plan 1 To feed based on cues 8-12x 24hr period. Mom to offer breasts and look for signs of milk transfer.  2. If unable to latch, Mom to hand express and offer EBM via spoon 5-7 ml  3. I and O sheet reviewed.  All questions answered at the end of the visit.  Mom aware to call RN or LC for latch assistance.   Maternal Data Has patient been taught Hand Expression?: Yes Does the patient have breastfeeding experience prior to this delivery?: No  Feeding Mother's Current Feeding Choice: Breast Milk  LATCH Score                    Lactation Tools Discussed/Used    Interventions Interventions: Breast feeding basics reviewed;Assisted with latch;Skin to skin;Breast massage;Hand express;Breast compression;Adjust position;Support pillows;Position options;Expressed milk;Education;LC Psychologist, educational;Infant Driven Feeding Algorithm education  Discharge Pump: Personal  Consult Status Consult Status: Follow-up Date: 12/16/21 Follow-up type: In-patient    Ripley Bogosian  Nicholson-Springer 12/15/2021, 6:29 PM

## 2021-12-15 NOTE — Progress Notes (Signed)
Reassessed patient at bedside after [facility] placement. Balloon deflated, no active bleeding noted. Jada removed. Will monitor for 30 minutes prior to transfer to mother baby. Ancef 2g x1 ordered.   Evalina Field, MD

## 2021-12-16 LAB — CBC
HCT: 31.2 % — ABNORMAL LOW (ref 36.0–46.0)
Hemoglobin: 9.9 g/dL — ABNORMAL LOW (ref 12.0–15.0)
MCH: 28.3 pg (ref 26.0–34.0)
MCHC: 31.7 g/dL (ref 30.0–36.0)
MCV: 89.1 fL (ref 80.0–100.0)
Platelets: 281 10*3/uL (ref 150–400)
RBC: 3.5 MIL/uL — ABNORMAL LOW (ref 3.87–5.11)
RDW: 14.3 % (ref 11.5–15.5)
WBC: 15.3 10*3/uL — ABNORMAL HIGH (ref 4.0–10.5)
nRBC: 0 % (ref 0.0–0.2)

## 2021-12-16 MED ORDER — SODIUM CHLORIDE 0.9 % IV SOLN: INTRAVENOUS | Status: AC

## 2021-12-16 MED ORDER — SODIUM CHLORIDE 0.9 % IV SOLN
500.0000 mg | Freq: Once | INTRAVENOUS | Status: DC
  Filled 2021-12-16: qty 25

## 2021-12-16 NOTE — Progress Notes (Signed)
Post Partum Day 1 Subjective: Patient is PP day 1. Reports cramping worse with breastfeeding that responds well to ibuprofen. Scant pink bleeding. States she can not stand up for long without feeling SOB & dizzy. Was taking oral iron during pregnancy but noted significant constipation. Has not had BM since delivery.   Objective: Blood pressure 107/68, pulse 63, temperature 98 F (36.7 C), temperature source Oral, resp. rate 17, height 5\' 7"  (1.702 m), weight (!) 144.2 kg, last menstrual period 03/02/2021, SpO2 100 %, unknown if currently breastfeeding.  Physical Exam:  General: alert, cooperative, and appears stated age 30: appropriate Uterine Fundus: firm Incision: n/a DVT Evaluation: No evidence of DVT seen on physical exam.  Recent Labs    12/15/21 0719 12/16/21 0450  HGB 10.0* 9.9*  HCT 31.9* 31.2*    Assessment/Plan: Plan for discharge tomorrow Has had minimal drop in hgb & VS stable, but patient symptomatic & hesitant to continue oral iron due to GI side effect. Will give dose of IV venofer while here.    LOS: 2 days   12/18/21 12/16/2021, 7:40 AM

## 2021-12-16 NOTE — Lactation Note (Signed)
This note was copied from a baby's chart. Lactation Consultation Note  Patient Name: Marie Williamson [id number] Date: 12/16/2021 Reason for consult: Follow-up assessment;1st time breastfeeding;Primapara;Term;Infant weight loss;Other (Comment) (7 % weight loss/ LC reviewed doc flow sheets with mom and updated. ( 2 wets and 2 stools in 37 hours ) .) Age:28 hours Baby awake and hungry/ LC checked diaper ( dry ),  LC offered to assist mom in the football see see if better depth at the breast could be obtained on the left breast. Started out on the cross cradle and the baby noted to be non - nutritive  more than nutritive. Reason for [facility] assisting to switch position.  After reverse pressure/ increased support/ deeper latch was obtained and more swallows.  LC recommended to elongate the nipple / areola complex for a consistent deeper latch / prior to latch - following steps:  Breast massage, hand express, pre- pump, reverse pressure on the 1st breast / and just reverse pressure on the 2nd prior to latch.  ( Mom aware the the prep steps before latching are to increase depth with latching therefore enhance wets and stools. )    Maternal Data Has patient been taught Hand Expression?: Yes  Feeding Mother's Current Feeding Choice: Breast Milk  LATCH Score Latch: Grasps breast easily, tongue down, lips flanged, rhythmical sucking.  Audible Swallowing: A few with stimulation  Type of Nipple: Everted at rest and after stimulation  Comfort (Breast/Nipple): Soft / non-tender  Hold (Positioning): Assistance needed to correctly position infant at breast and maintain latch.  LATCH Score: 8   Lactation Tools Discussed/Used Tools: Pump;Flanges Flange Size: 24;27;Other (comment) (LC assessed flange - #24 F borderline fit / #27 F seems to fit better for room ( mom ok with either one).) Breast pump type: Manual Pump Education: Setup, frequency, and cleaning (hand pump already set up and LC reviewed and  check the flange size.)  Interventions Interventions: Breast feeding basics reviewed;Assisted with latch;Skin to skin;Breast massage;Reverse pressure;Breast compression;Adjust position;Support pillows;Position options;Education;Hand pump;LC Services brochure  Discharge Pump: Personal;DEBP;Manual  Consult Status Consult Status: Follow-up Date: 12/17/21 Follow-up type: In-patient    Matilde Sprang Kanan Sobek 12/16/2021, 6:53 PM

## 2021-12-17 MED ORDER — ACETAMINOPHEN 325 MG PO TABS: 650.0000 mg | ORAL_TABLET | ORAL | Status: DC | PRN

## 2021-12-17 MED ORDER — IBUPROFEN 600 MG PO TABS: 600.0000 mg | ORAL_TABLET | Freq: Four times a day (QID) | ORAL | 0 refills | Status: DC | PRN

## 2021-12-17 NOTE — Lactation Note (Signed)
This note was copied from a baby's chart. Lactation Consultation Note  Patient Name: Marie Williamson [id number] Date: 12/17/2021 Reason for consult: Follow-up assessment;Primapara;1st time breastfeeding;Term;Breastfeeding assistance;Other (Comment) (mom called with feeding cues/ per dad baby had a large wet. Areola more compressible and elongated compared to yesterday. Reverse pressure is really helping and pre-pumping. Baby Latched w/ increased depth/ LC added SNS) Age:57 hours/ 10 % weight loss/ wets and stools increased since yesterday.  Baby tolerated the 5 F SNS after the baby was latched and took 24 ml.  Latch score 8.  Per mom the latch was the most comfortable compared to other feedings.  LC plan:  Steps for latching the same ,  Added the 59F SNS after the baby is latched as shown ( have dad help )  Feed 1st breast / supplement / post pump with DEBP both breast 10 -15 mins , save milk for next feedings.  Next feeding switch to the other breast and follow the steps.   Mom is doing well with the steps.   D/C is still pending F/U appt.   Maternal Data Has patient been taught Hand Expression?: Yes  Feeding Mother's Current Feeding Choice: Breast Milk and Formula  LATCH Score Latch: Grasps breast easily, tongue down, lips flanged, rhythmical sucking.  Audible Swallowing: Spontaneous and intermittent  Type of Nipple: Everted at rest and after stimulation  Comfort (Breast/Nipple): Filling, red/small blisters or bruises, mild/mod discomfort  Hold (Positioning): Assistance needed to correctly position infant at breast and maintain latch.  LATCH Score: 8   Lactation Tools Discussed/Used Tools: Pump;Flanges Flange Size: 24;27 Breast pump type: Manual Pump Education: Setup, frequency, and cleaning;Milk Storage Reason for Pumping: steps for latching - prep prior to latch to elongate the nipple / areola complex  Interventions Interventions: Breast feeding basics  reviewed;Assisted with latch;Skin to skin;Hand express;Breast massage;Pre-pump if needed;Reverse pressure;Breast compression;Adjust position;Support pillows;Position options;Hand pump;Education;LC Services brochure  Discharge Discharge Education: Engorgement and breast care;Warning signs for feeding baby Pump: Personal;DEBP;Manual WIC Program: Yes  Consult Status Consult Status: Complete Date: 12/17/21 Follow-up type: In-patient    Matilde Sprang Angelicia Lessner 12/17/2021, 1:17 PM

## 2021-12-17 NOTE — Lactation Note (Signed)
This note was copied from a baby's chart. Lactation Consultation Note  Patient Name: Marie Williamson [id number] Date: 12/17/2021 Reason for consult: Primapara;1st time breastfeeding;Term;Infant weight loss;Other (Comment);Follow-up assessment;Nipple pain/trauma (10 % weight loss / Voids and stools increased since yesterday.  and per mom the prepping prior to latch has improved latching. baby is asleep ( was fed 30 ml earlier) mom aware to call with feeding cues.) Age:29 hours   Maternal Data    Feeding Mother's Current Feeding Choice: Breast Milk and Formula  LATCH Score                    Lactation Tools Discussed/Used Tools: Pump;Flanges Flange Size: 24;27 Breast pump type: Manual Pump Education: Setup, frequency, and cleaning;Milk Storage Reason for Pumping: steps for latching - prep prior to latch to elongate the nipple / areola complex  Interventions Interventions: Breast feeding basics reviewed;Hand pump;Education;LC Services brochure  Discharge Discharge Education: Engorgement and breast care;Warning signs for feeding baby Pump: Personal;Manual;DEBP  Consult Status Consult Status: Follow-up Date: 12/17/21 Follow-up type: In-patient    Marie Williamson 12/17/2021, 11:46 AM

## 2021-12-21 ENCOUNTER — Other Ambulatory Visit: Payer: Self-pay

## 2021-12-21 ENCOUNTER — Ambulatory Visit (INDEPENDENT_AMBULATORY_CARE_PROVIDER_SITE_OTHER): Payer: Medicaid Other | Admitting: Clinical

## 2021-12-21 DIAGNOSIS — F4323 Adjustment disorder with mixed anxiety and depressed mood: Secondary | ICD-10-CM | POA: Diagnosis not present

## 2021-12-21 NOTE — Patient Instructions (Signed)
[facility] [address] [place], [place] [address] [phone] (main office) [phone] (Alechia Lezama's office)   

## 2021-12-22 NOTE — BH Specialist Note (Signed)
Integrated Behavioral Health via Telemedicine Visit  12/22/2021 [place] [phone]  Number of Integrated Behavioral Health visits: 6 Session Start time: 1:19  Session End time: 1:34 Total time: 15  Referring Provider: Nolene Bernheim, NP Patient/Family location: Home [facility] Provider location: [facility]  All persons participating in visit: [name] and [facility] Marie Williamson   Types of Service: Individual psychotherapy and Telephone visit  I connected with Marie Williamson and/or Marie Williamson's  n/a  via  Telephone or Video Enabled Telemedicine Application  (Video is Caregility application) and verified that I am speaking with the correct person using two identifiers. Discussed confidentiality: Yes   I discussed the limitations of telemedicine and the availability of in person appointments.  Discussed there is a possibility of technology failure and discussed alternative modes of communication if that failure occurs.  I discussed that engaging in this telemedicine visit, they consent to the provision of behavioral healthcare and the services will be billed under their insurance.  Patient and/or legal guardian expressed understanding and consented to Telemedicine visit: Yes   Presenting Concerns: Patient and/or family reports the following symptoms/concerns: Pt is adjusting well to new motherhood and bonding well with baby;  FOB and pt's mother are very supportive; new puppy also adjusting well to new baby.   Patient and/or Family's Strengths/Protective Factors: Social connections, Social and Patent attorney, [place]crete supports in place (healthy food, safe environments, etc.), Sense of purpose, and Physical Health (exercise, healthy diet, medication compliance, etc.)  Goals Addressed: Patient will:  Maintain reduction of  symptoms of: anxiety and depression   Progress towards  Goals: Achieved  Interventions: Interventions utilized:  Supportive Reflection Standardized Assessments completed: Not Needed  Patient and/or Family Response: Pt adjusting well and will call for additional appointment as needed  Assessment: Patient currently experiencing History of Adjustment disorder with mixed anxious and depressed mood.   Patient may benefit from supportive reflection today.  Plan: Follow up with behavioral health clinician on : Call Marie Williamson at [phone], as needed Behavioral recommendations:  -Continue prioritizing healthy self-care daily (healthy sleep; healthy meals); working together as a supportive family team Referral(s): Integrated [employer] (In Clinic)  I discussed the assessment and treatment plan with the patient and/or parent/guardian. They were provided an opportunity to ask questions and all were answered. They agreed with the plan and demonstrated an understanding of the instructions.   They were advised to call back or seek an in-person evaluation if the symptoms worsen or if the condition fails to improve as anticipated.  Marie Lips, LCSW  Depression screen [facility] 2/9 12/06/2021 11/27/2021 11/06/2021 10/05/2021 10/03/2021  Decreased Interest 0 0 0 0 0  Down, Depressed, Hopeless 0 0 0 0 0  PHQ - 2 Score 0 0 0 0 0  Altered sleeping 0 0 0 1 1  Tired, decreased energy 0 1 1 1  0  Change in appetite 0 0 0 0 2  Feeling bad or failure about yourself  0 0 0 0 -  Trouble concentrating 0 0 0 0 0  Moving slowly or fidgety/restless 0 0 0 0 0  Suicidal thoughts 0 0 0 0 0  PHQ-9 Score 0 1 1 2 3   Difficult doing work/chores - - - - -  Some recent data might be hidden   GAD 7 : Generalized Anxiety Score 12/06/2021 11/27/2021 11/06/2021 10/05/2021  Nervous, Anxious, on Edge 0 1 1 1   Control/stop worrying  0 0 0 0  Worry too much - different things 0 0 0 1  Trouble relaxing 0 0 0 0  Restless 0 0 0 0  Easily annoyed or irritable 0 0 0 1   Afraid - awful might happen 0 0 0 0  Total GAD 7 Score 0 1 1 3    Edinburgh Postnatal Depression Scale Screening Tool 12/28/2021 12/17/2021  I have been able to laugh and see the funny side of things. 0 0  I have looked forward with enjoyment to things. 0 0  I have blamed myself unnecessarily when things went wrong. 1 1  I have been anxious or worried for no good reason. 0 0  I have felt scared or panicky for no good reason. 0 0  Things have been getting on top of me. 1 0  I have been so unhappy that I have had difficulty sleeping. 0 0  I have felt sad or miserable. 0 0  I have been so unhappy that I have been crying. 0 0  The thought of harming myself has occurred to me. 0 0  Edinburgh Postnatal Depression Scale Total 2 1

## 2021-12-28 ENCOUNTER — Telehealth (HOSPITAL_COMMUNITY): Payer: Self-pay | Admitting: *Deleted

## 2021-12-28 NOTE — Telephone Encounter (Signed)
Hospital Discharge Follow-Up Call:  Patient reports that she is well and has no concerns about her healing process.  EPDS today was 2 and she endorses this accurately reflects that she is doing well emotionally.  Patient reports that baby is well.  Baby cluster fed last night, leading patient to wonder if baby was getting enough milk during breastfeeding.  Last night was the 1st time baby has not seemed content after breastfeeding.  Offered information about Out-Patient LC as well as [employer].  She says that baby sleeps in a bassinet beside her bed.  ABCs of Safe Sleep reviewed.

## 2022-01-04 ENCOUNTER — Ambulatory Visit (INDEPENDENT_AMBULATORY_CARE_PROVIDER_SITE_OTHER): Payer: Medicaid Other | Admitting: Clinical

## 2022-01-04 DIAGNOSIS — F4323 Adjustment disorder with mixed anxiety and depressed mood: Secondary | ICD-10-CM

## 2022-01-17 ENCOUNTER — Ambulatory Visit (INDEPENDENT_AMBULATORY_CARE_PROVIDER_SITE_OTHER): Payer: Medicaid Other | Admitting: Certified Nurse Midwife

## 2022-01-17 ENCOUNTER — Encounter: Payer: Self-pay | Admitting: Certified Nurse Midwife

## 2022-01-17 ENCOUNTER — Other Ambulatory Visit: Payer: Self-pay

## 2022-01-17 DIAGNOSIS — Z30018 Encounter for initial prescription of other contraceptives: Secondary | ICD-10-CM | POA: Diagnosis not present

## 2022-01-17 MED ORDER — PHEXXI 1.8-1-0.4 % VA GEL: 1.0000 "application " | VAGINAL | 11 refills | Status: AC | PRN

## 2022-01-17 NOTE — Progress Notes (Signed)
Post Partum Visit Note  Marie Williamson is a 31 y.o. G31P1001 female who presents for a postpartum visit. She is  4.5  weeks postpartum following a normal spontaneous vaginal delivery.  I have fully reviewed the prenatal and intrapartum course. The delivery was at 41.1 gestational weeks.  Anesthesia: local. Postpartum course has been uncomplicated. Baby is doing well. Baby is feeding by breast (exclusively pumping, about q3hrs during the day, q4-5hr at night). Bleeding ceased completely about a week ago and then started again yesterday as thick, heavy lochia. Bowel function is normal. Bladder function is normal. Patient is not sexually active. Contraception method is none. Postpartum depression screening: negative.   Upstream - 01/18/22 1134       Pregnancy Intention Screening   Does the patient want to become pregnant in the next year? No    Does the patient's partner want to become pregnant in the next year? No    Would the patient like to discuss contraceptive options today? Yes      Contraception Wrap Up   Current Method Abstinence    End Method Female Condom;Spermicide (Used Alone)   Will use phexxi with condoms   Contraception Counseling Provided Yes            The pregnancy intention screening data noted above was reviewed. Potential methods of contraception were discussed. The patient elected to proceed with Female Condom; Spermicide (Used Alone) (Will use phexxi with condoms).   Edinburgh Postnatal Depression Scale - 01/17/22 1047       Edinburgh Postnatal Depression Scale:  In the Past 7 Days   I have been able to laugh and see the funny side of things. 0    I have looked forward with enjoyment to things. 0    I have blamed myself unnecessarily when things went wrong. 0    I have been anxious or worried for no good reason. 0    I have felt scared or panicky for no good reason. 0    Things have been getting on top of me. 0    I have been so unhappy that I have had  difficulty sleeping. 0    I have felt sad or miserable. 0    I have been so unhappy that I have been crying. 0    The thought of harming myself has occurred to me. 0    Edinburgh Postnatal Depression Scale Total 0             Health Maintenance Due  Topic Date Due   COVID-19 Vaccine (1) Never done   The following portions of the patient's history were reviewed and updated as appropriate: allergies, current medications, past family history, past medical history, past social history, past surgical history, and problem list.  Review of Systems Pertinent items noted in HPI and remainder of comprehensive ROS otherwise negative.  Objective:  BP 110/70    Pulse (!) 55    Wt (!) 308 lb (139.7 kg)    BMI 48.24 kg/m    Physical Exam Vitals and nursing note reviewed.  Constitutional:      General: She is in acute distress.     Appearance: Normal appearance. She is not ill-appearing.  HENT:     Head: Normocephalic.  Eyes:     Pupils: Pupils are equal, round, and reactive to light.  Cardiovascular:     Rate and Rhythm: Normal rate and regular rhythm.     Pulses: Normal pulses.  Pulmonary:  Effort: Pulmonary effort is normal.  Abdominal:     Palpations: Abdomen is soft.     Tenderness: There is no abdominal tenderness.  Genitourinary:    Comments: Return of bleeding likely menstrual. Musculoskeletal:        General: Normal range of motion.  Skin:    General: Skin is warm and dry.     Capillary Refill: Capillary refill takes less than 2 seconds.  Neurological:     Mental Status: She is alert and oriented to person, place, and time.  Psychiatric:        Mood and Affect: Mood normal.        Behavior: Behavior normal.        Thought Content: Thought content normal.   Assessment & Plan:  Postpartum care and examination of lactating mother - advised bleeding is likely menstrual meaning she is fertile again - reviewed ways to keep milk supply up prior to periods  Essential  components of care per ACOG recommendations:  1.  Mood and well being: Patient with negative depression screening today. Reviewed local resources for support.  - Patient tobacco use? No.   - hx of drug use? No.    2. Infant care and feeding:  -Patient currently breastmilk feeding? Yes. Discussed returning to work and pumping. Reviewed importance of draining breast regularly to support lactation.  -Social determinants of health (SDOH) reviewed in EPIC. No concerns  3. Sexuality, contraception and birth spacing - Patient does not want a pregnancy in the next year.  Desired family size is 1 child for now.  - Reviewed forms of contraception in tiered fashion. Patient desired condoms, phexxi  today.   - Discussed birth spacing of 18 months  4. Sleep and fatigue -Encouraged family/partner/community support of 4 hrs of uninterrupted sleep to help with mood and fatigue  5. Physical Recovery  - Discussed patients delivery and complications. She describes her labor as good. - Patient had a Vaginal problems after delivery including PPH (Jada placed and removed without incident, Venofer given in hospital) . Patient had a 1st degree laceration. Perineal healing reviewed. Patient expressed understanding - Patient has urinary incontinence? No. - Patient is safe to resume physical and sexual activity when ready  6.  Health Maintenance - HM due items addressed Yes - Last pap smear  Diagnosis  Date Value Ref Range Status  05/23/2021   Final   - Negative for intraepithelial lesion or malignancy (NILM)   Pap smear not done at today's visit.  -Breast Cancer screening indicated? No.   7. Chronic Disease/Pregnancy Condition follow up: None - PCP follow up as needed  Gabriel Carina, [place] [facility]

## 2022-01-18 ENCOUNTER — Encounter: Payer: Self-pay | Admitting: Certified Nurse Midwife

## 2022-02-15 ENCOUNTER — Encounter: Payer: Self-pay | Admitting: Certified Nurse Midwife

## 2022-03-26 ENCOUNTER — Encounter (HOSPITAL_COMMUNITY): Payer: Self-pay | Admitting: Emergency Medicine

## 2022-03-26 ENCOUNTER — Emergency Department (HOSPITAL_COMMUNITY)
Admission: EM | Admit: 2022-03-26 | Discharge: 2022-03-27 | Disposition: A | Discharge status: Home / Self Care | Payer: Medicaid Other | Attending: Emergency Medicine | Admitting: Emergency Medicine

## 2022-03-26 DIAGNOSIS — S59912A Unspecified injury of left forearm, initial encounter: Secondary | ICD-10-CM | POA: Diagnosis not present

## 2022-03-26 DIAGNOSIS — Y92410 Unspecified street and highway as the place of occurrence of the external cause: Secondary | ICD-10-CM | POA: Diagnosis not present

## 2022-03-26 DIAGNOSIS — S60812A Abrasion of left wrist, initial encounter: Secondary | ICD-10-CM | POA: Insufficient documentation

## 2022-03-26 DIAGNOSIS — M79602 Pain in left arm: Secondary | ICD-10-CM

## 2022-03-26 DIAGNOSIS — S6992XA Unspecified injury of left wrist, hand and finger(s), initial encounter: Secondary | ICD-10-CM | POA: Diagnosis present

## 2022-03-26 DIAGNOSIS — M25532 Pain in left wrist: Secondary | ICD-10-CM
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
## 2022-03-27 ENCOUNTER — Emergency Department (HOSPITAL_COMMUNITY): Payer: Medicaid Other

## 2022-03-27 MED ORDER — ACETAMINOPHEN 325 MG PO TABS
650.0000 mg | ORAL_TABLET | Freq: Once | ORAL | Status: AC
  Administered 2022-03-27: 650 mg via ORAL
  Filled 2022-03-27: qty 2

## 2022-03-27 MED ORDER — CYCLOBENZAPRINE HCL 10 MG PO TABS: 10.0000 mg | ORAL_TABLET | Freq: Two times a day (BID) | ORAL | 0 refills | Status: AC | PRN

## 2022-03-27 MED ORDER — IBUPROFEN 400 MG PO TABS
600.0000 mg | ORAL_TABLET | Freq: Once | ORAL | Status: AC
  Administered 2022-03-27: 600 mg via ORAL
  Filled 2022-03-27: qty 1

## 2022-03-27 MED ORDER — IBUPROFEN 600 MG PO TABS
600.0000 mg | ORAL_TABLET | Freq: Four times a day (QID) | ORAL | 0 refills | Status: AC | PRN
Start: 2022-03-27 — End: ?
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
## 2022-03-27 NOTE — Discharge Instructions (Addendum)
If your left wrist pain is still hurting you in 1 week, you should call to schedule follow-up appointment at the orthopedic clinic.  It is possible he have a small fracture we cannot visualize today on your x-ray, but may be visible on a repeat x-ray in 7 or 10 days.  In the meantime you can use ibuprofen at home and Tylenol for pain.  You can use ice packs or frozen vegetables or peas for 10 minutes at a time on your left wrist where it is swollen.  You should avoid any heavy lifting with your left hand. ?

## 2022-06-14 IMAGING — US US FETAL BPP W/ NON-STRESS
1 series · 13 of 13 positions shown · non-contrast
Comparison: none

[Series 1: us fetal bpp w/ non-stress · 13 acquisitions, 13 frames shown]
[im 1/13]
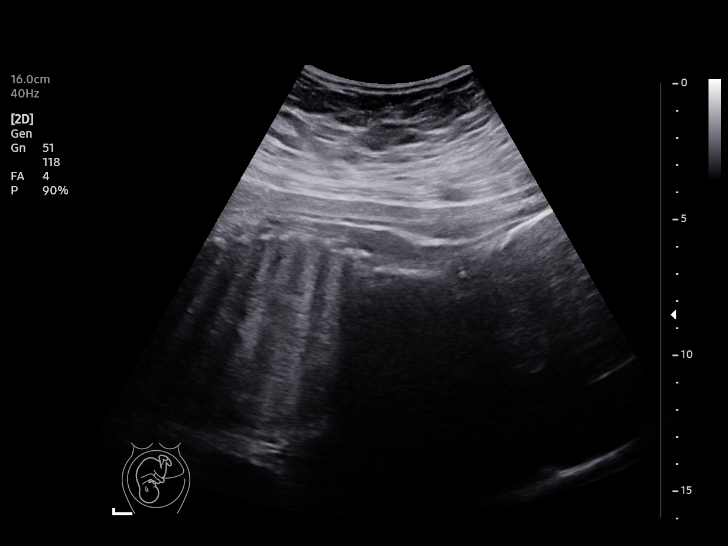
[im 2/13]
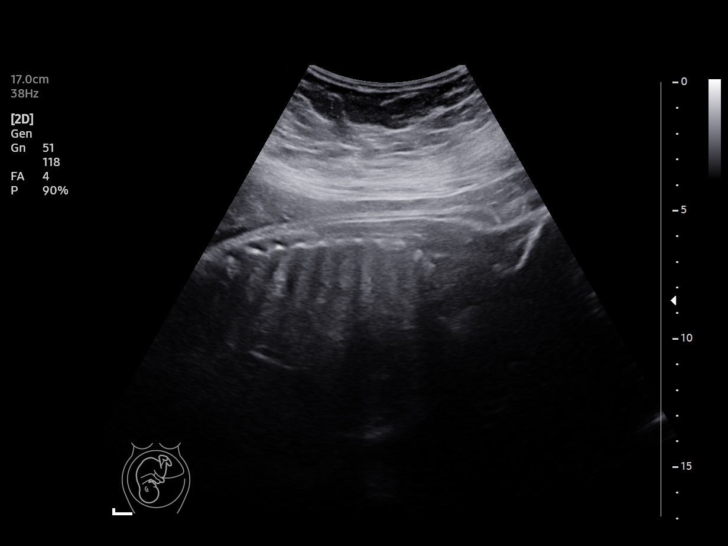
[im 3/13]
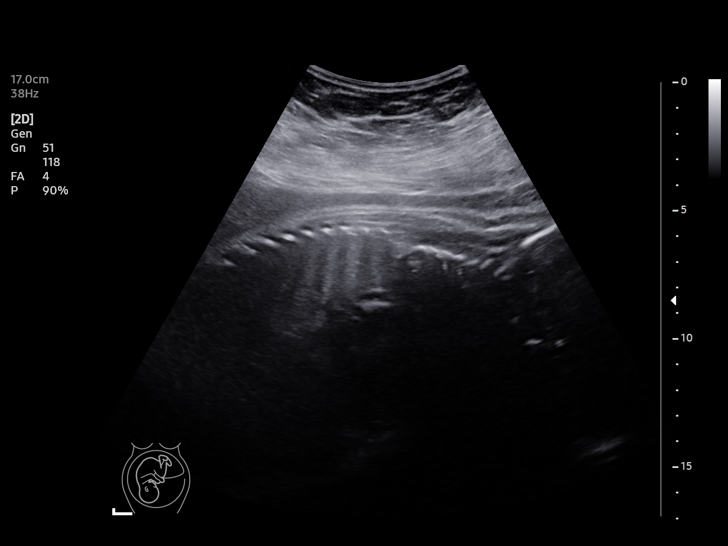
[im 4/13]
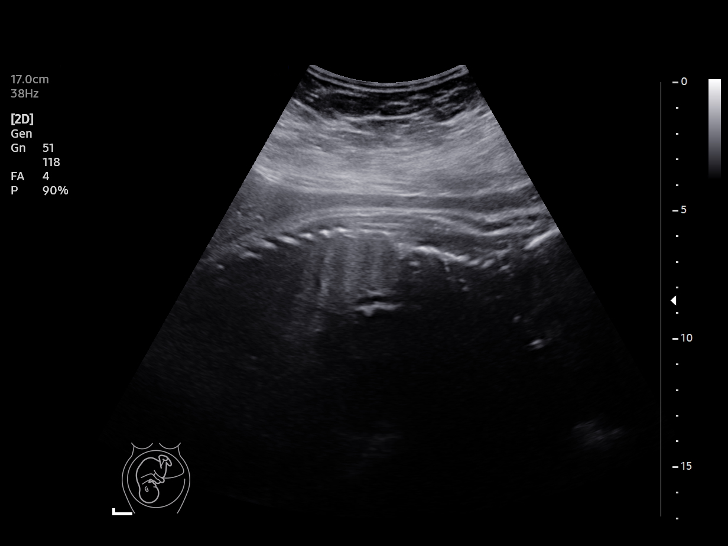
[im 5/13]
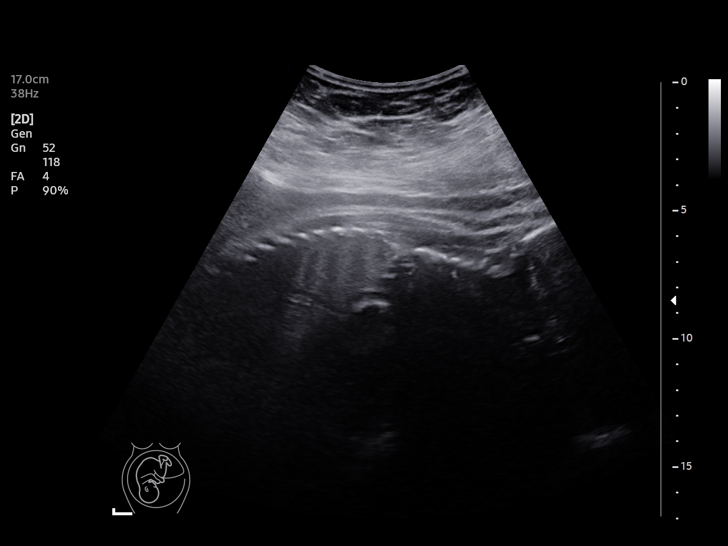
[im 6/13]
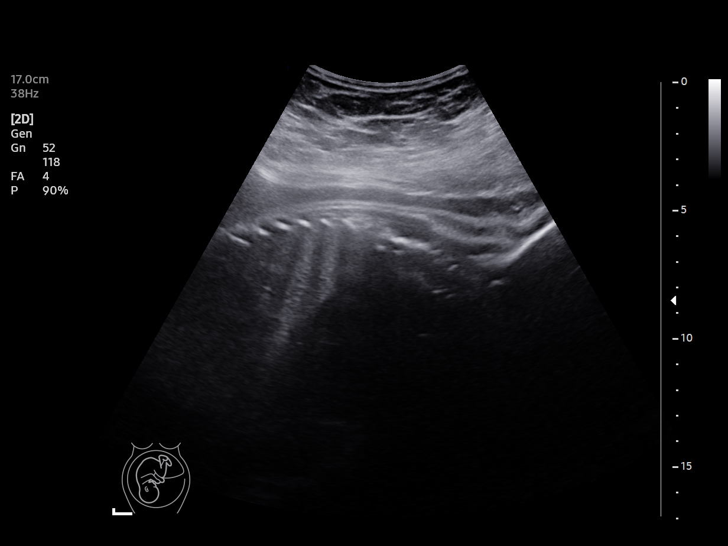
[im 7/13]
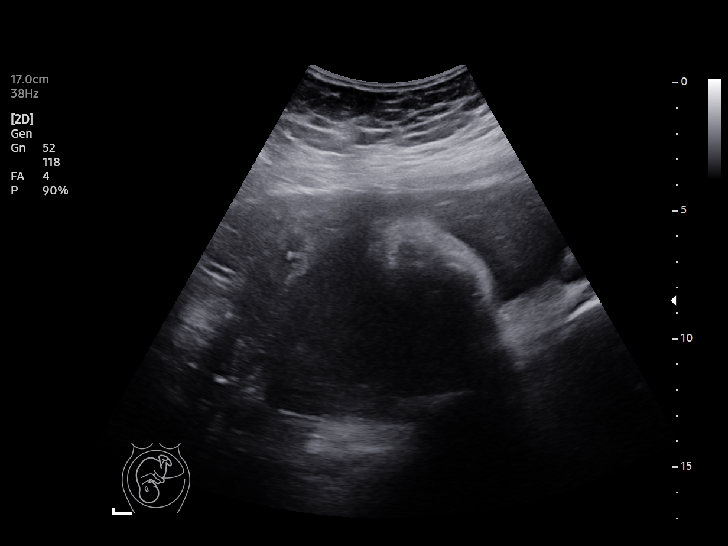
[im 8/13]
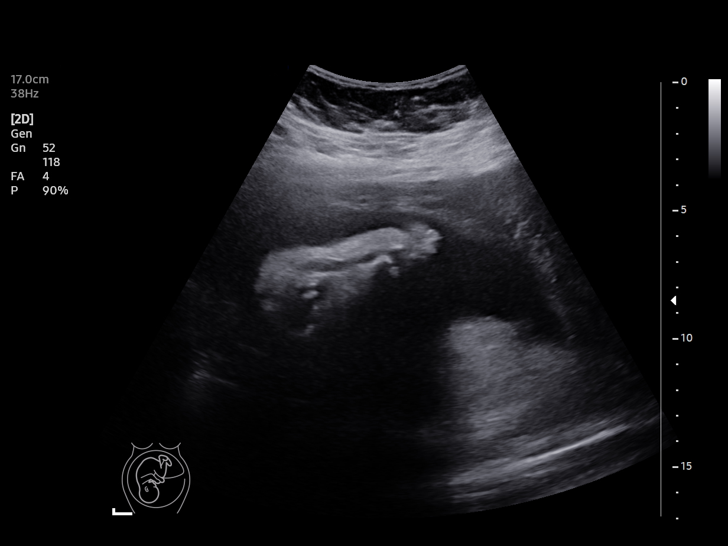
[im 9/13]
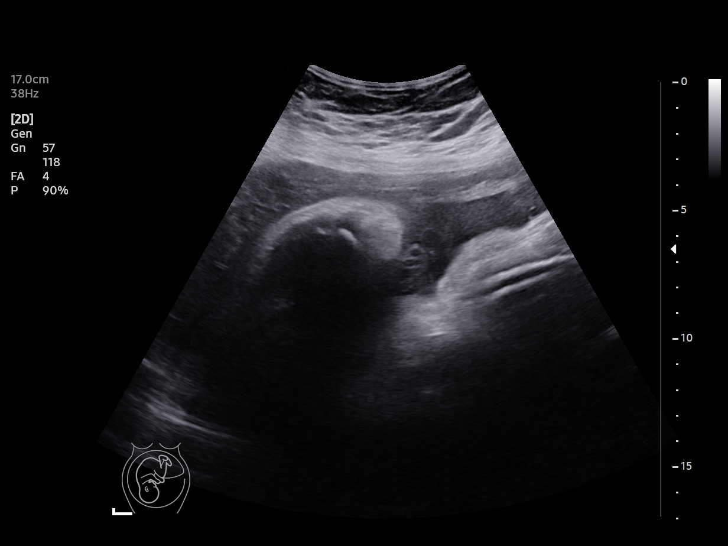
[im 10/13]
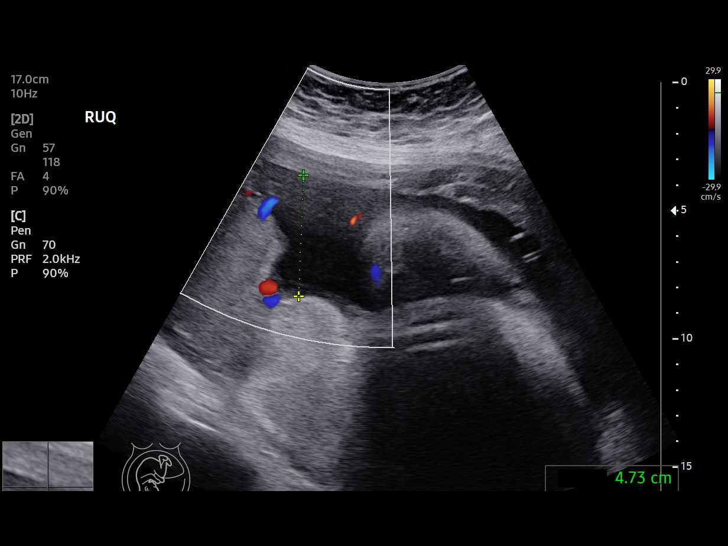
[im 11/13]
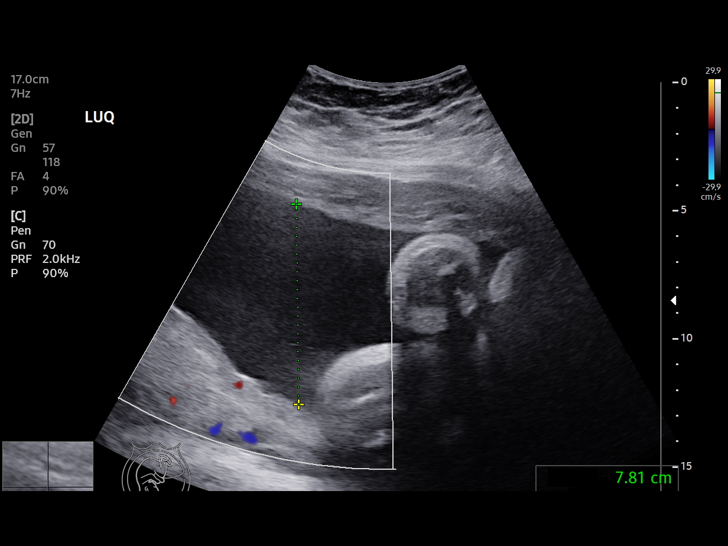
[im 12/13]
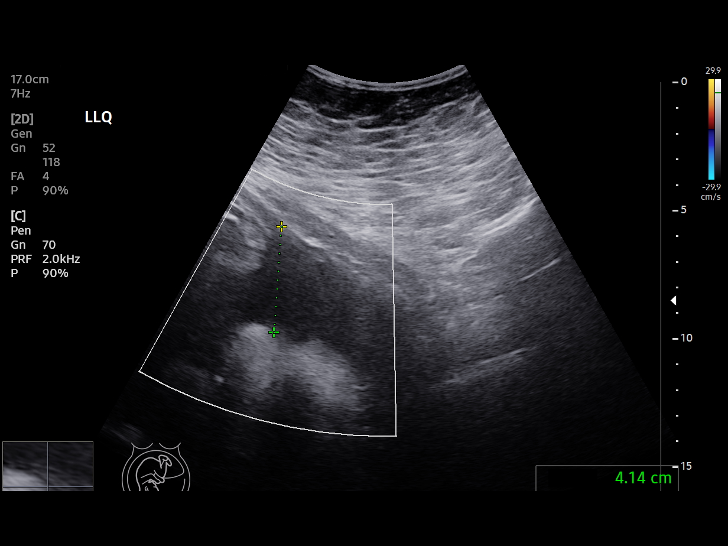
[im 13/13]
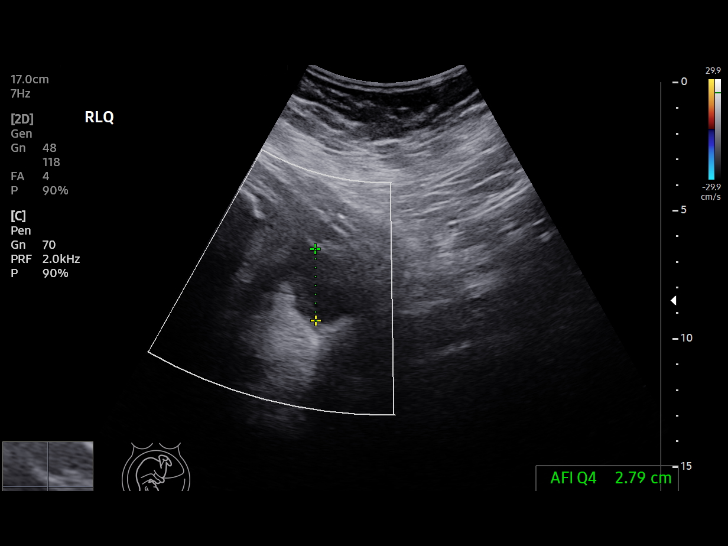

[13 of 13 positions shown; findings below may reference images not displayed]

[REDACTED]care at

 1  US FETAL BPP W/NONSTRESS              76818.4     KAMOGELO NKAMO DHARU

Service(s) Provided

Indications

 37 weeks gestation of pregnancy
 Obesity complicating pregnancy, third
 trimester
Fetal Evaluation

 Num Of Fetuses:         1
 Preg. Location:         Intrauterine
 Cardiac Activity:       Observed
 Presentation:           Cephalic

 Amniotic Fluid
 AFI FV:      Subjectively upper-normal

 AFI Sum(cm)     %Tile       Largest Pocket(cm)
 19.47           76

 RUQ(cm)       RLQ(cm)       LUQ(cm)        LLQ(cm)

Biophysical Evaluation
 Amniotic F.V:   Pocket => 2 cm             F. Tone:        Observed
 F. Movement:    Observed                   N.S.T:          Reactive
 F. Breathing:   Observed                   Score:          [DATE]
OB History

 Blood Type:   A+
 Gravidity:    1         Term:   0        Prem:   0        SAB:   0
 TOP:          0       Ectopic:  0        Living: 0
Gestational Age

 LMP:           37w 4d        Date:  03/02/21                 EDD:   12/07/21
 Best:          37w 4d     Det. By:  LMP  (03/02/21)          EDD:   12/07/21
Impression

 NST is reactive. BPP [DATE].
Recommendations

 Continue weekly antenatal testing till delivery .
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
## 2022-08-13 ENCOUNTER — Ambulatory Visit: Payer: Medicaid Other | Attending: Physician Assistant

## 2022-08-13 DIAGNOSIS — M546 Pain in thoracic spine: Secondary | ICD-10-CM | POA: Insufficient documentation

## 2022-08-13 DIAGNOSIS — R293 Abnormal posture: Secondary | ICD-10-CM | POA: Insufficient documentation

## 2022-08-23 ENCOUNTER — Ambulatory Visit: Payer: Medicaid Other

## 2022-08-23 DIAGNOSIS — M546 Pain in thoracic spine: Secondary | ICD-10-CM

## 2022-08-23 DIAGNOSIS — R293 Abnormal posture: Secondary | ICD-10-CM

## 2022-08-23 NOTE — Therapy (Signed)
OUTPATIENT PHYSICAL THERAPY TREATMENT NOTE   Patient Name: Marie Williamson MRN: [id number] DOB:1991/04/13, 31 y.o., female Today's Date: 08/23/2022  PCP: [facility] REFERRING PROVIDER: Quita Skye, PA-C  END OF SESSION:   PT End of Session - 08/23/22 1620     Visit Number 2    Number of Visits 4    Date for PT Re-Evaluation 10/08/22    Authorization Type MCD    PT Start Time 1620    PT Stop Time 1700    PT Time Calculation (min) 40 min    Activity Tolerance Patient tolerated treatment well    Behavior During Therapy [facility] for tasks assessed/performed             Past Medical History:  Diagnosis Date   Anemia affecting pregnancy in third trimester 11/26/2021   Medical history non-contributory    Past Surgical History:  Procedure Laterality Date   NO PAST SURGERIES     Patient Active Problem List   Diagnosis Date Noted   History of COVID-19 06/07/2021   Elevated blood pressure reading without diagnosis of hypertension 05/23/2021    REFERRING DIAG: thoracic back pain   THERAPY DIAG: thoracic back pain    Rationale for Evaluation and Treatment Rehabilitation  PERTINENT HISTORY: Unremarkable  PRECAUTIONS: None  SUBJECTIVE: Doing well until last night when spasms re-occurred  PAIN:  Are you having pain? Yes: NPRS scale: 4/10 Pain location: thoracic region Pain description: ache Aggravating factors: undetermined Relieving factors: position changes   OBJECTIVE: (objective measures completed at initial evaluation unless otherwise dated)  DIAGNOSTIC FINDINGS:  unremarkable   PATIENT SURVEYS:  Modified Oswestry 15/50    SCREENING FOR RED FLAGS:            None   COGNITION:           Overall cognitive status: Within functional limits for tasks assessed                          SENSATION: Not tested   MUSCLE LENGTH: Not tested   POSTURE: rounded shoulders   PALPATION: Not tested   LUMBAR ROM:    Active  A/PROM   eval  Flexion 90%  Extension 90%  Right lateral flexion 90%  Left lateral flexion 90%  Right rotation 75%  Left rotation 75%   (Blank rows = not tested)   LOWER EXTREMITY ROM:   WNL throughout   Active  Right eval Left eval  Hip flexion      Hip extension      Hip abduction      Hip adduction      Hip internal rotation      Hip external rotation      Knee flexion      Knee extension      Ankle dorsiflexion      Ankle plantarflexion      Ankle inversion      Ankle eversion       (Blank rows = not tested)   LOWER EXTREMITY MMT:  WFL throughout   MMT Right eval Left eval  Hip flexion      Hip extension      Hip abduction      Hip adduction      Hip internal rotation      Hip external rotation      Knee flexion      Knee extension      Ankle dorsiflexion  Ankle plantarflexion      Ankle inversion      Ankle eversion       (Blank rows = not tested)   LUMBAR SPECIAL TESTS:  N/A   FUNCTIONAL TESTS:  5 times sit to stand: 11s arms crossed   GAIT: Distance walked: 5ft x2 Assistive device utilized: None Level of assistance: Complete Independence Comments: unremarkable       TODAY'S TREATMENT  [facility] Adult PT Treatment:                                                DATE: 08/23/22 Therapeutic Exercise: UBE L1 3/3 min Supine flexion w/cane 15x emphasizing breathing patterns Supine alt UE flexion 15/15 Supine hor abd YTB 15x emphasizing breathing patterns Chin tuck 3x10 Open book emphasizing breathing 10/10    PATIENT EDUCATION:  Education details: Discussed eval findings, rehab rationale and POC and patient is in agreement  Person educated: Patient Education method: Explanation Education comprehension: verbalized understanding and needs further education     HOME EXERCISE PROGRAM: Access Code: [email or web address] URL: [email or web address] Date: 08/13/2022 Prepared by: Gustavus Bryant   Exercises - Seated Thoracic Lumbar Extension  - 2 x  daily - 7 x weekly - 2 sets - 10 reps - Seated Thoracic Flexion and Extension  - 2 x daily - 7 x weekly - 1 sets - 3 reps - 30s hold   ASSESSMENT:   CLINICAL IMPRESSION: Today's session focused on postural training, thoracic and rib mobility, emphasized proper breathing patterns to promote rib excursion.  Mobility much improved following stretches and treatment     OBJECTIVE IMPAIRMENTS decreased endurance, decreased knowledge of condition, decreased mobility, decreased ROM, hypomobility, increased fascial restrictions, increased muscle spasms, and pain.    ACTIVITY LIMITATIONS carrying, lifting, bending, standing, and sleeping   PERSONAL FACTORS Age, Fitness, and Transportation are also affecting patient's functional outcome.    REHAB POTENTIAL: Good   CLINICAL DECISION MAKING: Stable/uncomplicated   EVALUATION COMPLEXITY: Low     GOALS: Goals reviewed with patient? Yes   SHORT TERM GOALS:=LONG TERM GOALS: Target date: 09/10/2022   Patient to demonstrate independence in HEP  Baseline:[email or web address] Goal status: INITIAL   2.  Decrease ODI score to 10/50 Baseline: 15/50 Goal status: INITIAL   3.  Reduce average pain level to 4/10 Baseline: Average pain 6/10 Goal status: INITIAL   4.  Increase B AROM thoracic rotation to 90% Baseline: B thoracic rotation 75% Goal status: INITIAL         PLAN: PT FREQUENCY: 1x/week   PT DURATION: 4 weeks   PLANNED INTERVENTIONS: Therapeutic exercises, Therapeutic activity, Neuromuscular re-education, Balance training, Gait training, Patient/Family education, Self Care, Joint mobilization, Joint manipulation, Dry Needling, Spinal manipulation, Spinal mobilization, Manual therapy, and Re-evaluation.   PLAN FOR NEXT SESSION: HEP f/u and update, postural retraining exercises, aerobic work, stretching and core tasks     Hildred Laser, PT 08/23/2022, 5:46 PM

## 2022-08-30 ENCOUNTER — Ambulatory Visit: Payer: Medicaid Other

## 2022-09-06 ENCOUNTER — Ambulatory Visit: Payer: Medicaid Other | Attending: Physician Assistant

## 2022-09-06 DIAGNOSIS — R293 Abnormal posture: Secondary | ICD-10-CM | POA: Insufficient documentation

## 2022-09-06 DIAGNOSIS — M546 Pain in thoracic spine: Secondary | ICD-10-CM | POA: Diagnosis present

## 2022-09-06 NOTE — Therapy (Signed)
OUTPATIENT PHYSICAL THERAPY TREATMENT NOTE   Patient Name: Marie Williamson MRN: [id number] DOB:12/01/1991, 31 y.o., female Today's Date: 09/06/2022  PCP: [facility] REFERRING PROVIDER: Quita Skye, PA-C  END OF SESSION:   PT End of Session - 09/06/22 1622     Visit Number 3    Number of Visits 4    Date for PT Re-Evaluation 10/08/22    Authorization Type MCD    PT Start Time 1620    PT Stop Time 1700    PT Time Calculation (min) 40 min    Activity Tolerance Patient tolerated treatment well    Behavior During Therapy [facility] for tasks assessed/performed              Past Medical History:  Diagnosis Date   Anemia affecting pregnancy in third trimester 11/26/2021   Medical history non-contributory    Past Surgical History:  Procedure Laterality Date   NO PAST SURGERIES     Patient Active Problem List   Diagnosis Date Noted   History of COVID-19 06/07/2021   Elevated blood pressure reading without diagnosis of hypertension 05/23/2021    REFERRING DIAG: thoracic back pain   THERAPY DIAG: thoracic back pain    Rationale for Evaluation and Treatment Rehabilitation  PERTINENT HISTORY: Unremarkable  PRECAUTIONS: None  SUBJECTIVE: Pain 0/10 today, able to manage flare-ups with stretches  PAIN:  Are you having pain? Yes: NPRS scale: 4/10 Pain location: thoracic region Pain description: ache Aggravating factors: undetermined Relieving factors: position changes   OBJECTIVE: (objective measures completed at initial evaluation unless otherwise dated)  DIAGNOSTIC FINDINGS:  unremarkable   PATIENT SURVEYS:  Modified Oswestry 15/50    SCREENING FOR RED FLAGS:            None   COGNITION:           Overall cognitive status: Within functional limits for tasks assessed                          SENSATION: Not tested   MUSCLE LENGTH: Not tested   POSTURE: rounded shoulders   PALPATION: Not tested   LUMBAR ROM:    Active   A/PROM  eval  Flexion 90%  Extension 90%  Right lateral flexion 90%  Left lateral flexion 90%  Right rotation 75%  Left rotation 75%   (Blank rows = not tested)   LOWER EXTREMITY ROM:   WNL throughout   Active  Right eval Left eval  Hip flexion      Hip extension      Hip abduction      Hip adduction      Hip internal rotation      Hip external rotation      Knee flexion      Knee extension      Ankle dorsiflexion      Ankle plantarflexion      Ankle inversion      Ankle eversion       (Blank rows = not tested)   LOWER EXTREMITY MMT:  WFL throughout   MMT Right eval Left eval  Hip flexion      Hip extension      Hip abduction      Hip adduction      Hip internal rotation      Hip external rotation      Knee flexion      Knee extension  Ankle dorsiflexion      Ankle plantarflexion      Ankle inversion      Ankle eversion       (Blank rows = not tested)   LUMBAR SPECIAL TESTS:  N/A   FUNCTIONAL TESTS:  5 times sit to stand: 11s arms crossed   GAIT: Distance walked: 10ft x2 Assistive device utilized: None Level of assistance: Complete Independence Comments: unremarkable       TODAY'S TREATMENT  [facility] Adult PT Treatment:                                                DATE: 09/06/22 Therapeutic Exercise: Nustep 6 min L4 Supine flexion w/cane 15x emphasizing breathing patterns Supine alt UE flexion 15/15 1# Supine hor abd YTB 15x emphasizing breathing patterns Chin tuck 3x10 over 1/2 roll Open book emphasizing breathing 10/10 Supine chest press with protraction 1# 15x Prone flexion 1# 15x Prone shoulder extension 1# 15x Prone scaption 1# 15x Prone hor abd 1# 15x Bird dog 10/10  [facility] Adult PT Treatment:                                                DATE: 08/23/22 Therapeutic Exercise: UBE L1 3/3 min Supine flexion w/cane 15x emphasizing breathing patterns Supine alt UE flexion 15/15 Supine hor abd YTB 15x emphasizing breathing  patterns Chin tuck 3x10 Open book emphasizing breathing 10/10    PATIENT EDUCATION:  Education details: Discussed eval findings, rehab rationale and POC and patient is in agreement  Person educated: Patient Education method: Explanation Education comprehension: verbalized understanding and needs further education     HOME EXERCISE PROGRAM: Access Code: [email or web address] URL: [email or web address] Date: 09/06/2022 Prepared by: Gustavus Bryant  Exercises - Sidelying Open Book Thoracic Lumbar Rotation and Extension  - 2 x daily - 7 x weekly - 1 sets - 10 reps - Bird Dog  - 2 x daily - 7 x weekly - 1 sets - 10 reps   ASSESSMENT:   CLINICAL IMPRESSION: Symptoms deceasing and patient is pain free today. When flare-ups occur she is able to resolve symptoms with HEP stretches. Added additional tasks today to continue postural training and emphasize thoracic mobility and rib excursion.     OBJECTIVE IMPAIRMENTS decreased endurance, decreased knowledge of condition, decreased mobility, decreased ROM, hypomobility, increased fascial restrictions, increased muscle spasms, and pain.    ACTIVITY LIMITATIONS carrying, lifting, bending, standing, and sleeping   PERSONAL FACTORS Age, Fitness, and Transportation are also affecting patient's functional outcome.    REHAB POTENTIAL: Good   CLINICAL DECISION MAKING: Stable/uncomplicated   EVALUATION COMPLEXITY: Low     GOALS: Goals reviewed with patient? Yes   SHORT TERM GOALS:=LONG TERM GOALS: Target date: 09/10/2022   Patient to demonstrate independence in HEP  Baseline:[email or web address] Goal status: INITIAL   2.  Decrease ODI score to 10/50 Baseline: 15/50 Goal status: INITIAL   3.  Reduce average pain level to 4/10 Baseline: Average pain 6/10 Goal status: INITIAL   4.  Increase B AROM thoracic rotation to 90% Baseline: B thoracic rotation 75% Goal status: INITIAL         PLAN: PT FREQUENCY: 1x/week   PT DURATION: 4 weeks    PLANNED  INTERVENTIONS: Therapeutic exercises, Therapeutic activity, Neuromuscular re-education, Balance training, Gait training, Patient/Family education, Self Care, Joint mobilization, Joint manipulation, Dry Needling, Spinal manipulation, Spinal mobilization, Manual therapy, and Re-evaluation.   PLAN FOR NEXT SESSION: Assess for DC     Hildred Laser, PT 09/06/2022, 5:00 PM

## 2022-09-13 ENCOUNTER — Ambulatory Visit: Payer: Medicaid Other

## 2022-09-20 ENCOUNTER — Ambulatory Visit: Payer: Medicaid Other

## 2022-09-20 DIAGNOSIS — M546 Pain in thoracic spine: Secondary | ICD-10-CM

## 2022-09-20 DIAGNOSIS — R293 Abnormal posture: Secondary | ICD-10-CM

## 2022-09-20 NOTE — Therapy (Signed)
OUTPATIENT PHYSICAL THERAPY TREATMENT NOTE/DC SUMMARY   Patient Name: Marie Williamson MRN: [id number] DOB:Nov 21, 1991, 31 y.o., female Today's Date: 09/20/2022  PCP: [facility] REFERRING PROVIDER: Jorge Ny, PA-C PHYSICAL THERAPY DISCHARGE SUMMARY  Visits from Start of Care: 4  Current functional level related to goals / functional outcomes: Goals met   Remaining deficits: none   Education / Equipment: HEP   Patient agrees to discharge. Patient goals were met. Patient is being discharged due to being pleased with the current functional level.  END OF SESSION:   PT End of Session - 09/20/22 1623     Visit Number 4    Number of Visits 4    Date for PT Re-Evaluation 10/08/22    Authorization Type MCD    PT Start Time 1622    PT Stop Time 1700    PT Time Calculation (min) 38 min    Activity Tolerance Patient tolerated treatment well    Behavior During Therapy WFL for tasks assessed/performed              Past Medical History:  Diagnosis Date   Anemia affecting pregnancy in third trimester 11/26/2021   Medical history non-contributory    Past Surgical History:  Procedure Laterality Date   NO PAST SURGERIES     Patient Active Problem List   Diagnosis Date Noted   History of COVID-19 06/07/2021   Elevated blood pressure reading without diagnosis of hypertension 05/23/2021    REFERRING DIAG: thoracic back pain   THERAPY DIAG: thoracic back pain    Rationale for Evaluation and Treatment Rehabilitation  PERTINENT HISTORY: Unremarkable  PRECAUTIONS: None  SUBJECTIVE: Pain 0/10 today, able to manage flare-ups with stretches, last report of symptoms was 2 weeks ago  PAIN:  Are you having pain? Yes: NPRS scale: 4/10 Pain location: thoracic region Pain description: ache Aggravating factors: undetermined Relieving factors: position changes   OBJECTIVE: (objective measures completed at initial evaluation unless otherwise  dated)  DIAGNOSTIC FINDINGS:  unremarkable   PATIENT SURVEYS:  Modified Oswestry 15/50; 09/20/22 0/50   SCREENING FOR RED FLAGS:            None   COGNITION:           Overall cognitive status: Within functional limits for tasks assessed                          SENSATION: Not tested   MUSCLE LENGTH: Not tested   POSTURE: rounded shoulders   PALPATION: Not tested   LUMBAR ROM:    Active  A/PROM  eval  Flexion 90%  Extension 90%  Right lateral flexion 90%  Left lateral flexion 90%  Right rotation 75%  Left rotation 75%   (Blank rows = not tested)   LOWER EXTREMITY ROM:   WNL throughout   Active  Right eval Left eval  Hip flexion      Hip extension      Hip abduction      Hip adduction      Hip internal rotation      Hip external rotation      Knee flexion      Knee extension      Ankle dorsiflexion      Ankle plantarflexion      Ankle inversion      Ankle eversion       (Blank rows = not tested)   LOWER EXTREMITY MMT:  [facility]  throughout   MMT Right eval Left eval  Hip flexion      Hip extension      Hip abduction      Hip adduction      Hip internal rotation      Hip external rotation      Knee flexion      Knee extension      Ankle dorsiflexion      Ankle plantarflexion      Ankle inversion      Ankle eversion       (Blank rows = not tested)   LUMBAR SPECIAL TESTS:  N/A   FUNCTIONAL TESTS:  5 times sit to stand: 11s arms crossed   GAIT: Distance walked: 59f x2 Assistive device utilized: None Level of assistance: Complete Independence Comments: unremarkable       TODAY'S TREATMENT  [facility] Adult PT Treatment:                                                DATE: 09/20/22 Therapeutic Exercise: Nustep 8 min L2 PPT 3s x10 Open book emphasizing breathing 10/10 90/90 hips/knees with OH flexion 30s x2 Bird dog 10/10 AROM review of cervical/thoracic spine showing full AROM  [facility] Adult PT Treatment:                                                 DATE: 09/06/22 Therapeutic Exercise: Nustep 6 min L4 Supine flexion w/cane 15x emphasizing breathing patterns Supine alt UE flexion 15/15 1# Supine hor abd YTB 15x emphasizing breathing patterns Chin tuck 3x10 over 1/2 roll Open book emphasizing breathing 10/10 Supine chest press with protraction 1# 15x Prone flexion 1# 15x Prone shoulder extension 1# 15x Prone scaption 1# 15x Prone hor abd 1# 15x Bird dog 10/10  [facility] Adult PT Treatment:                                                DATE: 08/23/22 Therapeutic Exercise: UBE L1 3/3 min Supine flexion w/cane 15x emphasizing breathing patterns Supine alt UE flexion 15/15 Supine hor abd YTB 15x emphasizing breathing patterns Chin tuck 3x10 Open book emphasizing breathing 10/10    PATIENT EDUCATION:  Education details: Discussed eval findings, rehab rationale and POC and patient is in agreement  Person educated: Patient Education method: Explanation Education comprehension: verbalized understanding and needs further education     HOME EXERCISE PROGRAM: Access Code: [email or web address]: [email or web address] Date: 09/20/2022 Prepared by: JSharlynn Oliphant Exercises - Sidelying Open Book Thoracic Lumbar Rotation and Extension  - 1 x daily - 3 x weekly - 1 sets - 10 reps - Bird Dog  - 1 x daily - 3 x weekly - 1 sets - 10 reps - Supine 90/90 Shoulder Flexion with Abdominal Bracing  - 1 x daily - 3 x weekly - 1 sets - 2 reps - 30s hold ASSESSMENT:   CLINICAL IMPRESSION: Goals met, patient has been pain free 2 weeks plus     OBJECTIVE IMPAIRMENTS decreased endurance, decreased knowledge of condition, decreased mobility, decreased ROM, hypomobility, increased fascial restrictions,  increased muscle spasms, and pain.    ACTIVITY LIMITATIONS carrying, lifting, bending, standing, and sleeping   PERSONAL FACTORS Age, Fitness, and Transportation are also affecting patient's functional outcome.    REHAB POTENTIAL:  Good   CLINICAL DECISION MAKING: Stable/uncomplicated   EVALUATION COMPLEXITY: Low     GOALS: Goals reviewed with patient? Yes   SHORT TERM GOALS=LONG TERM GOALS: Target date: 09/10/2022   Patient to demonstrate independence in HEP  Baseline:[id number] Goal status: Met   2.  Decrease ODI score to 10/50 Baseline: 15/50; 09/20/22 0/50 Goal status: Met   3.  Reduce average pain level to 4/10 Baseline: Average pain 6/10; 09/20/22 Pain 0/10  Goal status: Met   4.  Increase B AROM thoracic rotation to 90% Baseline: B thoracic rotation 75%; 09/20/22 90% B  Goal status: Met         PLAN: PT FREQUENCY: 1x/week   PT DURATION: 4 weeks   PLANNED INTERVENTIONS: Therapeutic exercises, Therapeutic activity, Neuromuscular re-education, Balance training, Gait training, Patient/Family education, Self Care, Joint mobilization, Joint manipulation, Dry Needling, Spinal manipulation, Spinal mobilization, Manual therapy, and Re-evaluation.   PLAN FOR NEXT SESSION: DC     Lanice Shirts, PT 09/20/2022, 5:00 PM
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
## 2023-02-20 ENCOUNTER — Emergency Department (HOSPITAL_COMMUNITY)
Admission: EM | Admit: 2023-02-20 | Discharge: 2023-02-20 | Disposition: A | Discharge status: Home / Self Care | Payer: No Typology Code available for payment source | Attending: Emergency Medicine | Admitting: Emergency Medicine

## 2023-02-20 ENCOUNTER — Emergency Department (HOSPITAL_COMMUNITY): Payer: Self-pay

## 2023-02-20 ENCOUNTER — Encounter (HOSPITAL_COMMUNITY): Payer: Self-pay

## 2023-02-20 ENCOUNTER — Other Ambulatory Visit: Payer: Self-pay

## 2023-02-20 DIAGNOSIS — S060X0A Concussion without loss of consciousness, initial encounter: Secondary | ICD-10-CM | POA: Insufficient documentation

## 2023-02-20 DIAGNOSIS — Y990 Civilian activity done for income or pay: Secondary | ICD-10-CM | POA: Insufficient documentation

## 2023-02-20 DIAGNOSIS — S0083XA Contusion of other part of head, initial encounter: Secondary | ICD-10-CM | POA: Insufficient documentation

## 2023-02-20 DIAGNOSIS — W228XXA Striking against or struck by other objects, initial encounter: Secondary | ICD-10-CM | POA: Insufficient documentation

## 2023-02-20 DIAGNOSIS — R519 Headache, unspecified: Secondary | ICD-10-CM | POA: Diagnosis present

## 2023-02-20 LAB — PREGNANCY, URINE: Preg Test, Ur: NEGATIVE

## 2023-02-20 MED ORDER — ACETAMINOPHEN 500 MG PO TABS
1000.0000 mg | ORAL_TABLET | Freq: Once | ORAL | Status: AC
  Administered 2023-02-20: 1000 mg via ORAL
  Filled 2023-02-20: qty 2
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
## 2024-04-22 ENCOUNTER — Ambulatory Visit (HOSPITAL_BASED_OUTPATIENT_CLINIC_OR_DEPARTMENT_OTHER): Payer: Self-pay | Admitting: Family Medicine
